# Patient Record
Sex: Female | Born: 1980 | Hispanic: No | Marital: Married | Smoking: Never smoker
Health system: Southern US, Community
[De-identification: ages and names within clinical notes are randomized; demographics above are authoritative.]

## PROBLEM LIST (undated history)

## (undated) DIAGNOSIS — Z3490 Encounter for supervision of normal pregnancy, unspecified, unspecified trimester: Principal | ICD-10-CM

## (undated) DIAGNOSIS — I10 Essential (primary) hypertension: Secondary | ICD-10-CM

## (undated) DIAGNOSIS — Q210 Ventricular septal defect: Secondary | ICD-10-CM

## (undated) DIAGNOSIS — Z9109 Other allergy status, other than to drugs and biological substances: Secondary | ICD-10-CM

## (undated) HISTORY — PX: WISDOM TOOTH EXTRACTION: SHX21

## (undated) HISTORY — DX: Encounter for supervision of normal pregnancy, unspecified, unspecified trimester: Z34.90

## (undated) HISTORY — DX: Essential (primary) hypertension: I10

---

## 2007-08-10 ENCOUNTER — Other Ambulatory Visit: Admission: RE | Admit: 2007-08-10 | Discharge: 2007-08-10 | Payer: Self-pay | Admitting: Obstetrics and Gynecology

## 2009-01-20 ENCOUNTER — Other Ambulatory Visit: Admission: RE | Admit: 2009-01-20 | Discharge: 2009-01-20 | Payer: Self-pay | Admitting: Obstetrics and Gynecology

## 2010-04-23 ENCOUNTER — Emergency Department (HOSPITAL_COMMUNITY): Admission: EM | Admit: 2010-04-23 | Discharge: 2010-04-24 | Payer: Self-pay | Admitting: Emergency Medicine

## 2010-05-18 ENCOUNTER — Other Ambulatory Visit: Admission: RE | Admit: 2010-05-18 | Discharge: 2010-05-18 | Payer: Self-pay | Admitting: Obstetrics & Gynecology

## 2010-12-01 ENCOUNTER — Other Ambulatory Visit: Payer: Self-pay

## 2010-12-01 DIAGNOSIS — Z0489 Encounter for examination and observation for other specified reasons: Secondary | ICD-10-CM

## 2010-12-14 ENCOUNTER — Other Ambulatory Visit (HOSPITAL_COMMUNITY): Payer: Self-pay | Admitting: Maternal and Fetal Medicine

## 2010-12-14 ENCOUNTER — Ambulatory Visit (HOSPITAL_COMMUNITY)
Admission: RE | Admit: 2010-12-14 | Discharge: 2010-12-14 | Disposition: A | Discharge status: Home / Self Care | Payer: No Typology Code available for payment source | Source: Ambulatory Visit | Attending: Obstetrics & Gynecology | Admitting: Obstetrics & Gynecology

## 2010-12-14 DIAGNOSIS — O358XX0 Maternal care for other (suspected) fetal abnormality and damage, not applicable or unspecified: Secondary | ICD-10-CM | POA: Insufficient documentation

## 2010-12-14 DIAGNOSIS — Z1389 Encounter for screening for other disorder: Secondary | ICD-10-CM | POA: Insufficient documentation

## 2010-12-14 DIAGNOSIS — Z0489 Encounter for examination and observation for other specified reasons: Secondary | ICD-10-CM

## 2010-12-14 DIAGNOSIS — Z363 Encounter for antenatal screening for malformations: Secondary | ICD-10-CM | POA: Insufficient documentation

## 2011-01-11 ENCOUNTER — Other Ambulatory Visit: Payer: Self-pay | Admitting: Obstetrics & Gynecology

## 2011-01-11 ENCOUNTER — Ambulatory Visit (HOSPITAL_COMMUNITY)
Admission: RE | Admit: 2011-01-11 | Discharge: 2011-01-11 | Disposition: A | Discharge status: Home / Self Care | Payer: No Typology Code available for payment source | Source: Ambulatory Visit | Attending: Obstetrics & Gynecology | Admitting: Obstetrics & Gynecology

## 2011-01-11 DIAGNOSIS — Z0489 Encounter for examination and observation for other specified reasons: Secondary | ICD-10-CM

## 2011-01-11 DIAGNOSIS — Z3689 Encounter for other specified antenatal screening: Secondary | ICD-10-CM | POA: Insufficient documentation

## 2011-01-25 ENCOUNTER — Ambulatory Visit (HOSPITAL_COMMUNITY): Payer: No Typology Code available for payment source

## 2011-01-28 LAB — DIFFERENTIAL
Basophils Absolute: 0 10*3/uL (ref 0.0–0.1)
Basophils Relative: 0 % (ref 0–1)
Eosinophils Absolute: 0 10*3/uL (ref 0.0–0.7)
Neutro Abs: 15 10*3/uL — ABNORMAL HIGH (ref 1.7–7.7)
Neutrophils Relative %: 95 % — ABNORMAL HIGH (ref 43–77)

## 2011-01-28 LAB — URINE MICROSCOPIC-ADD ON

## 2011-01-28 LAB — CBC
MCHC: 34.7 g/dL (ref 30.0–36.0)
MCV: 85 fL (ref 78.0–100.0)
Platelets: 292 10*3/uL (ref 150–400)
RDW: 12 % (ref 11.5–15.5)

## 2011-01-28 LAB — URINALYSIS, ROUTINE W REFLEX MICROSCOPIC
Bilirubin Urine: NEGATIVE
Glucose, UA: NEGATIVE mg/dL
Ketones, ur: NEGATIVE mg/dL
Leukocytes, UA: NEGATIVE
Nitrite: NEGATIVE
Protein, ur: NEGATIVE mg/dL
Specific Gravity, Urine: 1.025 (ref 1.005–1.030)
Urobilinogen, UA: 0.2 mg/dL (ref 0.0–1.0)
pH: 7.5 (ref 5.0–8.0)

## 2011-01-28 LAB — BASIC METABOLIC PANEL
BUN: 8 mg/dL (ref 6–23)
CO2: 25 mEq/L (ref 19–32)
Calcium: 9.2 mg/dL (ref 8.4–10.5)
Chloride: 103 mEq/L (ref 96–112)
Creatinine, Ser: 0.94 mg/dL (ref 0.4–1.2)
GFR calc Af Amer: >60 mL/min (ref >60)
Glucose, Bld: 85 mg/dL (ref 70–99)

## 2011-01-28 LAB — PREGNANCY, URINE: Preg Test, Ur: NEGATIVE

## 2011-02-08 ENCOUNTER — Other Ambulatory Visit: Payer: Self-pay | Admitting: Obstetrics & Gynecology

## 2011-02-08 ENCOUNTER — Ambulatory Visit (HOSPITAL_COMMUNITY)
Admission: RE | Admit: 2011-02-08 | Discharge: 2011-02-08 | Disposition: A | Discharge status: Home / Self Care | Payer: No Typology Code available for payment source | Source: Ambulatory Visit | Attending: Obstetrics & Gynecology | Admitting: Obstetrics & Gynecology

## 2011-02-08 DIAGNOSIS — IMO0002 Reserved for concepts with insufficient information to code with codable children: Secondary | ICD-10-CM

## 2011-02-08 DIAGNOSIS — Z0489 Encounter for examination and observation for other specified reasons: Secondary | ICD-10-CM

## 2011-02-08 DIAGNOSIS — Z3689 Encounter for other specified antenatal screening: Secondary | ICD-10-CM | POA: Insufficient documentation

## 2011-03-22 ENCOUNTER — Ambulatory Visit (HOSPITAL_COMMUNITY): Payer: No Typology Code available for payment source

## 2011-04-28 ENCOUNTER — Observation Stay (HOSPITAL_COMMUNITY)
Admission: AD | Admit: 2011-04-28 | Discharge: 2011-04-28 | Disposition: A | Discharge status: Home / Self Care | Payer: No Typology Code available for payment source | Source: Ambulatory Visit | Attending: Obstetrics and Gynecology | Admitting: Obstetrics and Gynecology

## 2011-04-28 DIAGNOSIS — O328XX0 Maternal care for other malpresentation of fetus, not applicable or unspecified: Principal | ICD-10-CM | POA: Insufficient documentation

## 2011-04-28 LAB — CBC
Hemoglobin: 13.4 g/dL (ref 12.0–15.0)
MCH: 31.4 pg (ref 26.0–34.0)
Platelets: 260 10*3/uL (ref 150–400)
RBC: 4.27 MIL/uL (ref 3.87–5.11)

## 2011-04-28 LAB — BASIC METABOLIC PANEL
CO2: 18 mEq/L — ABNORMAL LOW (ref 19–32)
Calcium: 9.4 mg/dL (ref 8.4–10.5)
GFR calc non Af Amer: >60 mL/min (ref >60)
Glucose, Bld: 86 mg/dL (ref 70–99)
Potassium: 3.6 mEq/L (ref 3.5–5.1)
Sodium: 130 mEq/L — ABNORMAL LOW (ref 135–145)

## 2011-04-28 LAB — RPR: RPR Ser Ql: NONREACTIVE

## 2011-05-03 ENCOUNTER — Encounter (HOSPITAL_COMMUNITY)
Admission: RE | Admit: 2011-05-03 | Discharge: 2011-05-03 | Payer: No Typology Code available for payment source | Source: Ambulatory Visit | Attending: Obstetrics and Gynecology | Admitting: Obstetrics and Gynecology

## 2011-05-03 ENCOUNTER — Encounter (HOSPITAL_COMMUNITY): Payer: Self-pay

## 2011-05-03 HISTORY — DX: Ventricular septal defect: Q21.0

## 2011-05-03 LAB — COMPREHENSIVE METABOLIC PANEL
ALT: 20 U/L (ref 0–35)
AST: 20 U/L (ref 0–37)
Alkaline Phosphatase: 140 U/L — ABNORMAL HIGH (ref 39–117)
CO2: 21 mEq/L (ref 19–32)
Calcium: 9.9 mg/dL (ref 8.4–10.5)
Glucose, Bld: 78 mg/dL (ref 70–99)
Potassium: 3.4 mEq/L — ABNORMAL LOW (ref 3.5–5.1)
Sodium: 130 mEq/L — ABNORMAL LOW (ref 135–145)
Total Protein: 7 g/dL (ref 6.0–8.3)

## 2011-05-03 LAB — RPR: RPR Ser Ql: NONREACTIVE

## 2011-05-04 ENCOUNTER — Other Ambulatory Visit: Payer: Self-pay | Admitting: Obstetrics & Gynecology

## 2011-05-04 ENCOUNTER — Inpatient Hospital Stay (HOSPITAL_COMMUNITY)
Admission: RE | Admit: 2011-05-04 | Discharge: 2011-05-06 | DRG: 766 | Disposition: A | Discharge status: Home / Self Care | Payer: No Typology Code available for payment source | Source: Ambulatory Visit | Attending: Obstetrics & Gynecology | Admitting: Obstetrics & Gynecology

## 2011-05-04 DIAGNOSIS — Z01812 Encounter for preprocedural laboratory examination: Secondary | ICD-10-CM

## 2011-05-04 DIAGNOSIS — Z01818 Encounter for other preprocedural examination: Secondary | ICD-10-CM

## 2011-05-04 DIAGNOSIS — O321XX0 Maternal care for breech presentation, not applicable or unspecified: Principal | ICD-10-CM | POA: Diagnosis present

## 2011-05-04 LAB — CBC
HCT: 37.7 % (ref 36.0–46.0)
Hemoglobin: 13.4 g/dL (ref 12.0–15.0)
MCV: 87.7 fL (ref 78.0–100.0)
RBC: 4.3 MIL/uL (ref 3.87–5.11)
WBC: 13.7 10*3/uL — ABNORMAL HIGH (ref 4.0–10.5)

## 2011-05-05 LAB — CBC
HCT: 29.9 % — ABNORMAL LOW (ref 36.0–46.0)
Hemoglobin: 10.1 g/dL — ABNORMAL LOW (ref 12.0–15.0)
MCH: 30.4 pg (ref 26.0–34.0)
MCV: 90.1 fL (ref 78.0–100.0)
Platelets: 198 10*3/uL (ref 150–400)
RBC: 3.32 MIL/uL — ABNORMAL LOW (ref 3.87–5.11)
WBC: 13.2 10*3/uL — ABNORMAL HIGH (ref 4.0–10.5)

## 2011-05-05 NOTE — Op Note (Signed)
[name], [name] NO.:  [id number]  MEDICAL RECORD NO.:  [id number]  LOCATION:  9126                          FACILITY:  [facility]  PHYSICIAN:  Lazaro Arms, M.D.   DATE OF BIRTH:  05-15-1981  DATE OF PROCEDURE:  05/04/2011 DATE OF DISCHARGE:                              OPERATIVE REPORT   PREOPERATIVE DIAGNOSES: 1. Intrauterine pregnancy at 40 weeks' gestation. 2. [name] breech presentation, status post failed external cephalic     version. 3. Spontaneous rupture of membranes with particulate meconium.  POST DIAGNOSES: 1. Intrauterine pregnancy at 26 weeks' gestation. 2. [name] breech presentation, status post failed external cephalic     version. 3. Spontaneous rupture of membranes with particulate meconium.  PROCEDURE:  Primary low-transverse cesarean resection.  SURGEON:  Lazaro Arms, M.D.  ANESTHESIA:  Spinal.  FINDINGS:  The patient was known to have a baby in the breech presentation.  She came in last week for external cephalic version, which was failed.  She was scheduled for C-section, Monday, 25th; however, she came in with rupture of membranes this morning.  At time of surgery, she was again confirmed to be in a frank breech presentation and undeveloped lower uterine segment.  Uterus, tubes, and ovaries normal.  We delivered a viable female at 7:14 with Apgars of 8 and 9, weighing 5 pounds and 12 ounces.  There was three-vessel cord.  Cord blood and cord gas were sent.  Cord pH was 7.30.  DESCRIPTION OF OPERATION:  The patient was taken to the operating room, placed in sitting position where she underwent spinal anesthetic.  She was then placed in supine position, tilted on the left side.  She was prepped and draped in usual sterile fashion.  A Foley catheter was placed.  Pfannenstiel's skin incision was made, carried down sharply through rectus fascia, scored in midline, extended laterally.  Fascia taken off the muscle superiorly and  inferiorly without difficulty. Muscles were divided.  The peritoneal cavity was entered.  The bladder blade was placed.  A low-transverse hysterotomy incision was made over this incision, delivered a viable female infant at 07:14 with Apgars of 8 and 9, weighing 5 pounds 12 ounces, three-vessel cord.  Cord blood was sent.  Cord pH 7.30.  The infant was in a frank breech presentation, assisted breech extraction was performed.  The infant underwent routine neonatal resuscitation.  I did do a DeLee in the abdomen and I got clear fluid, so obviously the particular meconium did not mix with the regular amniotic fluid.  She experienced rupture of membranes and since it was breech, she did not make out there the fluid was clear out of the nasogastric suction.  The placenta was delivered spontaneously.  The uterus was then exteriorized and closed in two layers, first being running interlocking layer, the second being imbricating layer, and several interrupted figure-of-eight sutures were placed for additional hemostasis.  The uterus was replaced into the peritoneal cavity.  The muscles and peritoneum reapproximated loosely.  The fascia closed using 0 Vicryl running.  The subcutaneous tissue was made hemostatic, irrigated, and reapproximated loosely.  The skin was closed using 4-0 Vicryl in a subcuticular  fashion with a Mellody Dance needle of 4-0 Vicryl and Dermabond. The patient tolerated procedure well.  She experienced 750 mL of blood loss and was taken to the recovery room in good and stable condition. All counts correct x3.  She received Ancef prophylactically.     Lazaro Arms, M.D.     Loraine Maple  D:  05/04/2011  T:  05/04/2011  Job:  [id number]  Electronically Signed by Duane Lope M.D. on 05/05/2011 10:05:57 AM

## 2011-05-08 NOTE — Discharge Summary (Signed)
  NAME:  [name], [name] NO.:  [id number]  MEDICAL RECORD NO.:  [id number]  LOCATION:  9126                          [facility]ILITY:  [facility]  PHYSICIAN:  Lazaro Arms, M.D.   DATE OF BIRTH:  23-Oct-1981  DATE OF ADMISSION:  05/04/2011 DATE OF DISCHARGE:  05/06/2011                              DISCHARGE SUMMARY   DISCHARGE DIAGNOSES: 1. Status post primary cesarean section for breech presentation,     failed version, and spontaneous rupture of membranes. 2. Unremarkable postoperative course.  PROCEDURE:  Primary low transverse resection.  SURGEON:  Lazaro Arms, MD  Please refer to the antepartum chart and the handwritten history and physical details for admission in the hospital.  HOSPITAL COURSE:  The patient was admitted for a C-section.  She had had a failed version attempt earlier in the week and was then scheduled to have a C-section on May 07, 2011, however, she presented prior to that with rupture of membranes.  Please refer to the operative note for details.  Postoperatively, she did well.  She tolerated clear liquids and regular diet and was without symptoms, was essentially ambulatory. She was afebrile.  Her abdomen was benign.  Her incision was clean, dry, and intact.  She was discharged home on the morning of postop day number technically 2 in good stable condition.  She was given Percocet and Motrin for pain and follow up in the office next week.  She already has an appointment in [facility].     Lazaro Arms, M.D.     Loraine Maple  D:  05/06/2011  T:  05/06/2011  Job:  [id number]  Electronically Signed by Duane Lope M.D. on 05/08/2011 02:33:50 PM

## 2011-05-12 NOTE — H&P (Signed)
NAME:  [name], [name] NO.:  [id number]  MEDICAL RECORD NO.:  [id number]  LOCATION:                                 FACILITY:  PHYSICIAN:  Tilda Burrow, M.D. DATE OF BIRTH:  November 20, 1980  DATE OF ADMISSION: DATE OF DISCHARGE:                             HISTORY & PHYSICAL   ADMITTING DIAGNOSES: 1. Pregnancy 38 weeks' gestation. 2. Persistent breech presentation. 3. History of maternal VSD (ventricular septal defect) with small left-     to-right shunt.  HISTORY OF PRESENT ILLNESS:  This 30 year old female gravida 1, para 0-0- 0, LMP unknown with ultrasound assigned EDC of May 11, 2011, based on 9- week 6-day ultrasound, followed through [facility] through the pregnancy with confirmatory ultrasound at 18 weeks and 6 days at [facility], is admitted at this time for external cephalic version.  She has had been found to be in a breech presentation with ultrasound on April 26, 2011, showing adequate amniotic fluid with fetal vertex and a breech presentation with the fetal back crossing the maternal spine with the head slightly to the left of the midline and buttocks anterior and to the right side.  Placenta is posteriorly located.  The patient has been counseled over external version with technical aspects of the procedure reviewed including risk of fetal distress associated with the procedure, need for fetal monitoring prior to and after procedure reviewed with the patient, the alternative of proceeding directly to schedule cesarean section at 39 weeks has been discussed. The patient requests proceeding with attempted external version which is scheduled for April 28, 2011.  MEDICAL HISTORY:  Significant for the patient has unrepaired VSD.  The patient has described her functioning baseline activity is equal to the baseline.  She had an maternal echo in Cardiology consult performed at [facility], January 30, 2011.  The  interpretation of the ultrasound showed a small perimembranous ventricular septal defect with peak gradient across the VSD 113 mmHg with "normal biventricular size and function."  Additionally, a fetal cardiac echo was performed at that date and showed a normal-appearing ventricular septum in the infant.  This patient's VSD has been discussed with Anesthesia and they will be asked to see the patient while she is in the hospital for her external version.  PAST SURGICAL HISTORY:  Oral only.  ALLERGIES:  None.  SOCIAL HISTORY:  Married, Armed forces operational officer.  PRENATAL LABS:  Blood type O positive, rubella immune present, hemoglobin 13, hematocrit 39, platelets 313,000.  Hepatitis, HIV, RPR, GC and Chlamydia all negative.  Pap smear normal.  MSAFP normal.  Group B strep negative.  Glucose tolerance test normal at 73, 114, 107.  A 28- week hematocrit 37.6 and platelets 270,000.  She attended childbirth classes.  Her partner, Ashari Llewellyn, is supportive.  She plans to take the baby to Dr. Laural Benes of [facility].  PHYSICAL EXAMINATION:  VITAL SIGNS:  Weight 216.4, blood pressure 120/80. CARDIAC:  Shows a large holosystolic murmur without appreciable S4 that is noticeable over the anterior precordium. ABDOMEN:  Gravid uterus, 37-cm fundal height with fetus considered to be growing within normal limits for this pregnancy, was at  the 40th percentile early in the pregnancy at 23 weeks.  PLAN:  External cephalic version on April 28, 2011, and anesthesia consult April 28, 2011.     Tilda Burrow, M.D.     JVF/MEDQ  D:  04/26/2011  T:  04/27/2011  Job:  [id number]  cc:   [facility]  Electronically Signed by Christin Bach M.D. on 05/12/2011 10:24:15 PM

## 2011-05-12 NOTE — Op Note (Signed)
  [name], EASTLAND NO.:  [id number]  MEDICAL RECORD NO.:  [id number]  LOCATION:  9172                          FACILITY:  [facility]  PHYSICIAN:  Tilda Burrow, M.D. DATE OF BIRTH:  1981-03-07  DATE OF PROCEDURE:  04/28/2011 DATE OF DISCHARGE:  04/28/2011                              OPERATIVE REPORT   PREOPERATIVE DIAGNOSIS:  Pregnancy 38 weeks' gestation, but persistent footling and breech presentation.  POSTOPERATIVE DIAGNOSIS:  Pregnancy 38 weeks' gestation, but persistent footling and breech presentation.  PROCEDURE:  Unsuccessful attempted external cephalic version.  SURGEON:  Tilda Burrow, MD  INDICATIONS:  Persistent breech presentation at 88 weeks' gestation, counseled and consented for external cephalic version to attempt to avoid need for cesarean section.  Blood type is confirmed as Rh positive.  DETAILS OF PROCEDURE:  After a lengthy delay due to OR obligations, terbutaline was administered after ultrasound confirmed breech presentation with vertex in the left upper quadrant and adequate fluid noted on ultrasound and the placenta noted to be posterior with forward roll technique used on several attempts.  The vertex could be brought down to approximately 3 o'clock on the abdomen, but could not be made to flip further.  The feet and the buttocks remained firmly settled into the lower uterine segment.  Maternal repositioning did not result in different outcome.  Intermittent fetal monitoring by ultrasound and external Doppler confirmed that heart rate remained normal range.  The baby will be monitored to meet NST reactive criteria prior to discharge and surgery scheduled for 1 week at 39 weeks' gestation.     Tilda Burrow, M.D.     JVF/MEDQ  D:  04/28/2011  T:  04/28/2011  Job:  [id number]  cc:   [facility]  Electronically Signed by Christin Bach M.D. on 05/12/2011 10:24:05 PM

## 2011-09-13 ENCOUNTER — Other Ambulatory Visit: Payer: Self-pay | Admitting: Obstetrics & Gynecology

## 2011-09-13 ENCOUNTER — Other Ambulatory Visit (HOSPITAL_COMMUNITY)
Admission: RE | Admit: 2011-09-13 | Discharge: 2011-09-13 | Disposition: A | Discharge status: Home / Self Care | Payer: No Typology Code available for payment source | Source: Ambulatory Visit | Attending: Obstetrics & Gynecology | Admitting: Obstetrics & Gynecology

## 2011-09-13 DIAGNOSIS — Z01419 Encounter for gynecological examination (general) (routine) without abnormal findings: Secondary | ICD-10-CM | POA: Insufficient documentation

## 2012-09-25 ENCOUNTER — Other Ambulatory Visit: Payer: Self-pay | Admitting: Obstetrics & Gynecology

## 2012-09-25 ENCOUNTER — Other Ambulatory Visit (HOSPITAL_COMMUNITY)
Admission: RE | Admit: 2012-09-25 | Discharge: 2012-09-25 | Disposition: A | Discharge status: Home / Self Care | Payer: No Typology Code available for payment source | Source: Ambulatory Visit | Attending: Obstetrics & Gynecology | Admitting: Obstetrics & Gynecology

## 2012-09-25 DIAGNOSIS — Z1151 Encounter for screening for human papillomavirus (HPV): Secondary | ICD-10-CM | POA: Insufficient documentation

## 2012-09-25 DIAGNOSIS — Z01419 Encounter for gynecological examination (general) (routine) without abnormal findings: Secondary | ICD-10-CM | POA: Insufficient documentation

## 2013-07-07 ENCOUNTER — Other Ambulatory Visit: Payer: Self-pay | Admitting: *Deleted

## 2013-07-07 MED ORDER — DESOGESTREL-ETHINYL ESTRADIOL 0.15-0.02/0.01 MG (21/5) PO TABS: 1.0000 | ORAL_TABLET | Freq: Every day | ORAL | Status: DC

## 2013-10-01 ENCOUNTER — Encounter: Payer: Self-pay | Admitting: Obstetrics & Gynecology

## 2013-10-01 ENCOUNTER — Ambulatory Visit (INDEPENDENT_AMBULATORY_CARE_PROVIDER_SITE_OTHER): Payer: PRIVATE HEALTH INSURANCE | Admitting: Obstetrics & Gynecology

## 2013-10-01 ENCOUNTER — Other Ambulatory Visit (HOSPITAL_COMMUNITY)
Admission: RE | Admit: 2013-10-01 | Discharge: 2013-10-01 | Disposition: A | Discharge status: Home / Self Care | Payer: No Typology Code available for payment source | Source: Ambulatory Visit | Attending: Obstetrics & Gynecology | Admitting: Obstetrics & Gynecology

## 2013-10-01 ENCOUNTER — Other Ambulatory Visit: Payer: Self-pay | Admitting: Obstetrics & Gynecology

## 2013-10-01 VITALS — BP 120/80 | Ht 65.0 in | Wt 197.0 lb

## 2013-10-01 DIAGNOSIS — Z01419 Encounter for gynecological examination (general) (routine) without abnormal findings: Secondary | ICD-10-CM | POA: Insufficient documentation

## 2013-10-01 MED ORDER — FLUCONAZOLE 150 MG PO TABS: ORAL_TABLET | ORAL | Status: DC

## 2013-10-01 NOTE — Addendum Note (Signed)
Addended by: Richardson Chiquito on: 10/01/2013 01:39 PM   Modules accepted: Orders

## 2013-10-01 NOTE — Progress Notes (Signed)
Patient ID: [name], female   DOB: Jan 23, 1981, 32 y.o.   MRN: [id number] Subjective:     [name] is a 32 y.o. female here for a routine exam.  Patient's last menstrual period was 09/28/2013. G1P1 Current complaints: post menses yeast infection.  Personal health questionnaire reviewed: .   Gynecologic History Patient's last menstrual period was 09/28/2013. Contraception: OCP (estrogen/progesterone) Last Pap: 2013. Results were: normal Last mammogram: na. Results were: na  Past Medical History  Diagnosis Date  . VSD (ventricular septal defect)     history of maternal septal defect with small left to right shunt-no prob    Past Surgical History  Procedure Laterality Date  . Wisdom tooth extraction      2005    OB History   Grav Para Term Preterm Abortions TAB SAB Ect Mult Living   1               History   Social History  . Marital Status: Married    Spouse Name: N/A    Number of Children: N/A  . Years of Education: N/A   Social History Main Topics  . Smoking status: Never Smoker   . Smokeless tobacco: None  . Alcohol Use: No  . Drug Use: No  . Sexual Activity: No   Other Topics Concern  . None   Social History Narrative  . None    Family History  Problem Relation Age of Onset  . Hypertension Mother   . Hypertension Father   . Stroke Paternal Grandmother   . Lung cancer Paternal Grandfather      Review of Systems  Review of Systems  Constitutional: Negative for fever, chills, weight loss, malaise/fatigue and diaphoresis.  HENT: Negative for hearing loss, ear pain, nosebleeds, congestion, sore throat, neck pain, tinnitus and ear discharge.   Eyes: Negative for blurred vision, double vision, photophobia, pain, discharge and redness.  Respiratory: Negative for cough, hemoptysis, sputum production, shortness of breath, wheezing and stridor.   Cardiovascular: Negative for chest pain, palpitations, orthopnea, claudication, leg swelling and PND.   Gastrointestinal: negative for abdominal pain. Negative for heartburn, nausea, vomiting, diarrhea, constipation, blood in stool and melena.  Genitourinary: Negative for dysuria, urgency, frequency, hematuria and flank pain.  Musculoskeletal: Negative for myalgias, back pain, joint pain and falls.  Skin: Negative for itching and rash.  Neurological: Negative for dizziness, tingling, tremors, sensory change, speech change, focal weakness, seizures, loss of consciousness, weakness and headaches.  Endo/Heme/Allergies: Negative for environmental allergies and polydipsia. Does not bruise/bleed easily.  Psychiatric/Behavioral: Negative for depression, suicidal ideas, hallucinations, memory loss and substance abuse. The patient is not nervous/anxious and does not have insomnia.        Objective:    Physical Exam  Vitals reviewed. Constitutional: She is oriented to person, place, and time. She appears well-developed and well-nourished.  HENT:  Head: Normocephalic and atraumatic.        Right Ear: External ear normal.  Left Ear: External ear normal.  Nose: Nose normal.  Mouth/Throat: Oropharynx is clear and moist.  Eyes: Conjunctivae and EOM are normal. Pupils are equal, round, and reactive to light. Right eye exhibits no discharge. Left eye exhibits no discharge. No scleral icterus.  Neck: Normal range of motion. Neck supple. No tracheal deviation present. No thyromegaly present.  Cardiovascular: Normal rate, regular rhythm, normal heart sounds and intact distal pulses. Grade III/VI systolic flow murmur(Pt has a congenital VSD, unrepaired)  Respiratory: Effort normal and breath sounds normal.  No respiratory distress. She has no wheezes. She has no rales. She exhibits no tenderness.  GI: Soft. Bowel sounds are normal. She exhibits no distension and no mass. There is no tenderness. There is no rebound and no guarding.  Genitourinary:  Breasts no masses skin changes or nipple changes bilaterally       Vulva is normal without lesions Vagina is pink moist without discharge Cervix normal in appearance and pap is done Uterus is normal size shape and contour Adnexa is negative with normal sized ovaries    Musculoskeletal: Normal range of motion. She exhibits no edema and no tenderness.  Neurological: She is alert and oriented to person, place, and time. She has normal reflexes. She displays normal reflexes. No cranial nerve deficit. She exhibits normal muscle tone. Coordination normal.  Skin: Skin is warm and dry. No rash noted. No erythema. No pallor.  Psychiatric: She has a normal mood and affect. Her behavior is normal. Judgment and thought content normal.       Assessment:    Healthy female exam.    Plan:    Follow up in: 1 year.

## 2013-11-26 ENCOUNTER — Telehealth: Payer: Self-pay | Admitting: Obstetrics and Gynecology

## 2013-11-26 NOTE — Telephone Encounter (Signed)
Pt states that she is currently on her period and last night she had a gush of blood with clots. Pt states that she has never had that happen before. Pt is concerned. Pt stated that she didn't have any cramping. Pt stated that this happened on the second day of her cycle. I spoke with JAG and she advised that sometimes a blood clot can get stuck which can cause a gush of fluid. Pt also advised that if things got worse or happen again to call the office. Pt was offered an appointment for early next week and she declined.

## 2014-03-14 ENCOUNTER — Other Ambulatory Visit: Payer: Self-pay | Admitting: *Deleted

## 2014-03-15 MED ORDER — FLUCONAZOLE 150 MG PO TABS: ORAL_TABLET | ORAL | Status: DC

## 2014-07-06 ENCOUNTER — Other Ambulatory Visit: Payer: Self-pay | Admitting: *Deleted

## 2014-07-06 MED ORDER — DESOGESTREL-ETHINYL ESTRADIOL 0.15-0.02/0.01 MG (21/5) PO TABS: 1.0000 | ORAL_TABLET | Freq: Every day | ORAL | Status: DC

## 2014-09-12 ENCOUNTER — Encounter: Payer: Self-pay | Admitting: Obstetrics & Gynecology

## 2014-10-14 ENCOUNTER — Other Ambulatory Visit: Payer: PRIVATE HEALTH INSURANCE | Admitting: Obstetrics & Gynecology

## 2014-10-14 ENCOUNTER — Other Ambulatory Visit (HOSPITAL_COMMUNITY)
Admission: RE | Admit: 2014-10-14 | Discharge: 2014-10-14 | Disposition: A | Discharge status: Home / Self Care | Payer: No Typology Code available for payment source | Source: Ambulatory Visit | Attending: Obstetrics & Gynecology | Admitting: Obstetrics & Gynecology

## 2014-10-14 ENCOUNTER — Ambulatory Visit (INDEPENDENT_AMBULATORY_CARE_PROVIDER_SITE_OTHER): Payer: PRIVATE HEALTH INSURANCE | Admitting: Obstetrics & Gynecology

## 2014-10-14 ENCOUNTER — Encounter: Payer: Self-pay | Admitting: Obstetrics & Gynecology

## 2014-10-14 VITALS — BP 130/80 | Ht 65.5 in | Wt 203.0 lb

## 2014-10-14 DIAGNOSIS — Z01419 Encounter for gynecological examination (general) (routine) without abnormal findings: Secondary | ICD-10-CM

## 2014-10-14 NOTE — Progress Notes (Signed)
Patient ID: [name], female   DOB: December 11, 1980, 33 y.o.   MRN: [id number] Subjective:     [name] is a 33 y.o. female here for a routine exam.  Patient's last menstrual period was 09/25/2014. G1P0 Birth Control Method:  OCP now stopped Menstrual Calendar(currently): regular  Current complaints: none.   Current acute medical issues:  none   Recent Gynecologic History Patient's last menstrual period was 09/25/2014. Last Pap: 2014,  normal Last mammogram: na,    Past Medical History  Diagnosis Date  . VSD (ventricular septal defect)     history of maternal septal defect with small left to right shunt-no prob    Past Surgical History  Procedure Laterality Date  . Wisdom tooth extraction      2005    OB History    Gravida Para Term Preterm AB TAB SAB Ectopic Multiple Living   1               History   Social History  . Marital Status: Married    Spouse Name: N/A    Number of Children: N/A  . Years of Education: N/A   Social History Main Topics  . Smoking status: Never Smoker   . Smokeless tobacco: Never Used  . Alcohol Use: No  . Drug Use: No  . Sexual Activity: Yes   Other Topics Concern  . None   Social History Narrative    Family History  Problem Relation Age of Onset  . Hypertension Mother   . Hypertension Father   . Stroke Paternal Grandmother   . Stroke Maternal Grandmother   . Cancer Maternal Grandfather     lung    Current outpatient prescriptions: fluconazole (DIFLUCAN) 150 MG tablet, Take 1 tablet daily, Disp: 7 tablet, Rfl: 1;  loratadine (CLARITIN) 10 MG tablet, Take 10 mg by mouth daily., Disp: , Rfl: ;  desogestrel-ethinyl estradiol (AZURETTE) 0.15-0.02/0.01 MG (21/5) tablet, Take 1 tablet by mouth daily. (Patient not taking: Reported on 10/14/2014), Disp: 1 Package, Rfl: 11 fluconazole (DIFLUCAN) 150 MG tablet, Take 1 tablet by mouth once daily for 7 days (Patient not taking: Reported on 10/14/2014), Disp: 7 tablet, Rfl: 0  Review  of Systems  Review of Systems  Constitutional: Negative for fever, chills, weight loss, malaise/fatigue and diaphoresis.  HENT: Negative for hearing loss, ear pain, nosebleeds, congestion, sore throat, neck pain, tinnitus and ear discharge.   Eyes: Negative for blurred vision, double vision, photophobia, pain, discharge and redness.  Respiratory: Negative for cough, hemoptysis, sputum production, shortness of breath, wheezing and stridor.   Cardiovascular: Negative for chest pain, palpitations, orthopnea, claudication, leg swelling and PND.  Gastrointestinal: negative for abdominal pain. Negative for heartburn, nausea, vomiting, diarrhea, constipation, blood in stool and melena.  Genitourinary: Negative for dysuria, urgency, frequency, hematuria and flank pain.  Musculoskeletal: Negative for myalgias, back pain, joint pain and falls.  Skin: Negative for itching and rash.  Neurological: Negative for dizziness, tingling, tremors, sensory change, speech change, focal weakness, seizures, loss of consciousness, weakness and headaches.  Endo/Heme/Allergies: Negative for environmental allergies and polydipsia. Does not bruise/bleed easily.  Psychiatric/Behavioral: Negative for depression, suicidal ideas, hallucinations, memory loss and substance abuse. The patient is not nervous/anxious and does not have insomnia.        Objective:  Blood pressure 130/80, height 5' 5.5" (1.664 m), weight 203 lb (92.08 kg), last menstrual period 09/25/2014, unknown if currently breastfeeding.   Physical Exam  Vitals reviewed. Constitutional: She is oriented to  person, place, and time. She appears well-developed and well-nourished.  HENT:  Head: Normocephalic and atraumatic.        Right Ear: External ear normal.  Left Ear: External ear normal.  Nose: Nose normal.  Mouth/Throat: Oropharynx is clear and moist.  Eyes: Conjunctivae and EOM are normal. Pupils are equal, round, and reactive to light. Right eye  exhibits no discharge. Left eye exhibits no discharge. No scleral icterus.  Neck: Normal range of motion. Neck supple. No tracheal deviation present. No thyromegaly present.  Cardiovascular: Normal rate, regular rhythm, normal heart sounds and intact distal pulses.  Exam reveals no gallop and no friction rub.   No murmur heard. Respiratory: Effort normal and breath sounds normal. No respiratory distress. She has no wheezes. She has no rales. She exhibits no tenderness.  GI: Soft. Bowel sounds are normal. She exhibits no distension and no mass. There is no tenderness. There is no rebound and no guarding.  Genitourinary:  Breasts no masses skin changes or nipple changes bilaterally      Vulva is normal without lesions Vagina is pink moist without discharge Cervix normal in appearance and pap is done Uterus is normal size shape and contour Adnexa is negative with normal sized ovaries   Musculoskeletal: Normal range of motion. She exhibits no edema and no tenderness.  Neurological: She is alert and oriented to person, place, and time. She has normal reflexes. She displays normal reflexes. No cranial nerve deficit. She exhibits normal muscle tone. Coordination normal.  Skin: Skin is warm and dry. No rash noted. No erythema. No pallor.  Psychiatric: She has a normal mood and affect. Her behavior is normal. Judgment and thought content normal.       Assessment:    Healthy female exam.    Plan:    wanting to get pregnant, will follow up then

## 2014-10-17 LAB — CYTOLOGY - PAP

## 2015-02-24 ENCOUNTER — Other Ambulatory Visit: Payer: Self-pay | Admitting: Obstetrics & Gynecology

## 2015-02-28 ENCOUNTER — Other Ambulatory Visit: Payer: Self-pay | Admitting: *Deleted

## 2015-02-28 MED ORDER — FLUCONAZOLE 150 MG PO TABS: ORAL_TABLET | ORAL | Status: DC

## 2015-07-10 ENCOUNTER — Ambulatory Visit: Payer: PRIVATE HEALTH INSURANCE | Admitting: Adult Health

## 2015-07-11 ENCOUNTER — Ambulatory Visit (INDEPENDENT_AMBULATORY_CARE_PROVIDER_SITE_OTHER): Payer: PRIVATE HEALTH INSURANCE | Admitting: Adult Health

## 2015-07-11 ENCOUNTER — Encounter: Payer: Self-pay | Admitting: Adult Health

## 2015-07-11 VITALS — BP 120/72 | HR 104 | Ht 67.0 in | Wt 212.0 lb

## 2015-07-11 DIAGNOSIS — N925 Other specified irregular menstruation: Secondary | ICD-10-CM | POA: Diagnosis not present

## 2015-07-11 DIAGNOSIS — Z3490 Encounter for supervision of normal pregnancy, unspecified, unspecified trimester: Secondary | ICD-10-CM

## 2015-07-11 DIAGNOSIS — Z3201 Encounter for pregnancy test, result positive: Secondary | ICD-10-CM | POA: Diagnosis not present

## 2015-07-11 DIAGNOSIS — O3680X0 Pregnancy with inconclusive fetal viability, not applicable or unspecified: Secondary | ICD-10-CM

## 2015-07-11 HISTORY — DX: Encounter for supervision of normal pregnancy, unspecified, unspecified trimester: Z34.90

## 2015-07-11 LAB — POCT URINE PREGNANCY: Preg Test, Ur: POSITIVE — AB

## 2015-07-11 MED ORDER — PRENATAL PLUS 27-1 MG PO TABS: 1.0000 | ORAL_TABLET | Freq: Every day | ORAL | Status: DC

## 2015-07-11 NOTE — Progress Notes (Signed)
Subjective:     Patient ID: [name], female   DOB: 1981/03/26, 34 y.o.   MRN: [id number]  HPI [name] is a 34 year old white female, married in for UPT, was not really trying get yet, but is happy, she has a headache and dizziness at times, she had with daughter who is now 11.She works at [employer] development [employer] in [place].She said she has taken diflucan recently for yeast, told to not take anymore.  Review of Systems Patient denies any hearing loss, fatigue, blurred vision, shortness of breath, chest pain, abdominal pain, problems with bowel movements, urination, or intercourse. No joint pain or mood swings.See HPI for positives.  Reviewed past medical,surgical, social and family history. Reviewed medications and allergies.     Objective:   Physical Exam BP 120/72 mmHg  Pulse 104  Ht  (1.702 m)  Wt 212 lb (96.163 kg)  BMI 33.20 kg/m2  LMP 05/22/2015 (Exact Date)  Breastfeeding? No UPT +, Skin warm and dry. Neck: mid line trachea, normal thyroid, good ROM, no lymphadenopathy noted. Lungs: clear to ausculation bilaterally. Cardiovascular: regular rate and rhythm, has murmur from SVD, abdomen soft, non tender, US shows IUP with ?YS no fetal pole seen yet, will get [place] 9/9, should be 7+1 week by LMP, with EDD 02/26/16, but not that far by [place].    Assessment:     Pregnant     Plan:     Return 9/9 for dating [place] Rx prenatal plus #30 take 1 daily with 11 refills  Review handout on first trimester Ok to take tylenol, can take dramamine if needed, declines diclegis, do not take ibuprofen

## 2015-07-11 NOTE — Patient Instructions (Signed)
First Trimester of Pregnancy The first trimester of pregnancy is from week 1 until the end of week 12 (months 1 through 3). A week after a sperm fertilizes an egg, the egg will implant on the wall of the uterus. This embryo will begin to develop into a baby. Genes from you and your partner are forming the baby. The female genes determine whether the baby is a boy or a girl. At 6-8 weeks, the eyes and face are formed, and the heartbeat can be seen on ultrasound. At the end of 12 weeks, all the baby's organs are formed.  Now that you are pregnant, you will want to do everything you can to have a healthy baby. Two of the most important things are to get good prenatal care and to follow your health care provider's instructions. Prenatal care is all the medical care you receive before the baby's birth. This care will help prevent, find, and treat any problems during the pregnancy and childbirth. BODY CHANGES Your body goes through many changes during pregnancy. The changes vary from woman to woman.   You may gain or lose a couple of pounds at first.  You may feel sick to your stomach (nauseous) and throw up (vomit). If the vomiting is uncontrollable, call your health care provider.  You may tire easily.  You may develop headaches that can be relieved by medicines approved by your health care provider.  You may urinate more often. Painful urination may mean you have a bladder infection.  You may develop heartburn as a result of your pregnancy.  You may develop constipation because certain hormones are causing the muscles that push waste through your intestines to slow down.  You may develop hemorrhoids or swollen, bulging veins (varicose veins).  Your breasts may begin to grow larger and become tender. Your nipples may stick out more, and the tissue that surrounds them (areola) may become darker.  Your gums may bleed and may be sensitive to brushing and flossing.  Dark spots or blotches (chloasma,  mask of pregnancy) may develop on your face. This will likely fade after the baby is born.  Your menstrual periods will stop.  You may have a loss of appetite.  You may develop cravings for certain kinds of food.  You may have changes in your emotions from day to day, such as being excited to be pregnant or being concerned that something may go wrong with the pregnancy and baby.  You may have more vivid and strange dreams.  You may have changes in your hair. These can include thickening of your hair, rapid growth, and changes in texture. Some women also have hair loss during or after pregnancy, or hair that feels dry or thin. Your hair will most likely return to normal after your baby is born. WHAT TO EXPECT AT YOUR PRENATAL VISITS During a routine prenatal visit:  You will be weighed to make sure you and the baby are growing normally.  Your blood pressure will be taken.  Your abdomen will be measured to track your baby's growth.  The fetal heartbeat will be listened to starting around week 10 or 12 of your pregnancy.  Test results from any previous visits will be discussed. Your health care provider may ask you:  How you are feeling.  If you are feeling the baby move.  If you have had any abnormal symptoms, such as leaking fluid, bleeding, severe headaches, or abdominal cramping.  If you have any questions. Other tests   that may be performed during your first trimester include:  Blood tests to find your blood type and to check for the presence of any previous infections. They will also be used to check for low iron levels (anemia) and Rh antibodies. Later in the pregnancy, blood tests for diabetes will be done along with other tests if problems develop.  Urine tests to check for infections, diabetes, or protein in the urine.  An ultrasound to confirm the proper growth and development of the baby.  An amniocentesis to check for possible genetic problems.  Fetal screens for  spina bifida and Down syndrome.  You may need other tests to make sure you and the baby are doing well. HOME CARE INSTRUCTIONS  Medicines  Follow your health care provider's instructions regarding medicine use. Specific medicines may be either safe or unsafe to take during pregnancy.  Take your prenatal vitamins as directed.  If you develop constipation, try taking a stool softener if your health care provider approves. Diet  Eat regular, well-balanced meals. Choose a variety of foods, such as meat or vegetable-based protein, fish, milk and low-fat dairy products, vegetables, fruits, and whole grain breads and cereals. Your health care provider will help you determine the amount of weight gain that is right for you.  Avoid raw meat and uncooked cheese. These carry germs that can cause birth defects in the baby.  Eating four or five small meals rather than three large meals a day may help relieve nausea and vomiting. If you start to feel nauseous, eating a few soda crackers can be helpful. Drinking liquids between meals instead of during meals also seems to help nausea and vomiting.  If you develop constipation, eat more high-fiber foods, such as fresh vegetables or fruit and whole grains. Drink enough fluids to keep your urine clear or pale yellow. Activity and Exercise  Exercise only as directed by your health care provider. Exercising will help you:  Control your weight.  Stay in shape.  Be prepared for labor and delivery.  Experiencing pain or cramping in the lower abdomen or low back is a good sign that you should stop exercising. Check with your health care provider before continuing normal exercises.  Try to avoid standing for long periods of time. Move your legs often if you must stand in one place for a long time.  Avoid heavy lifting.  Wear low-heeled shoes, and practice good posture.  You may continue to have sex unless your health care provider directs you  otherwise. Relief of Pain or Discomfort  Wear a good support bra for breast tenderness.   Take warm sitz baths to soothe any pain or discomfort caused by hemorrhoids. Use hemorrhoid cream if your health care provider approves.   Rest with your legs elevated if you have leg cramps or low back pain.  If you develop varicose veins in your legs, wear support hose. Elevate your feet for 15 minutes, 3-4 times a day. Limit salt in your diet. Prenatal Care  Schedule your prenatal visits by the twelfth week of pregnancy. They are usually scheduled monthly at first, then more often in the last 2 months before delivery.  Write down your questions. Take them to your prenatal visits.  Keep all your prenatal visits as directed by your health care provider. Safety  Wear your seat belt at all times when driving.  Make a list of emergency phone numbers, including numbers for family, friends, the hospital, and police and fire departments. General Tips    Ask your health care provider for a referral to a local prenatal education class. Begin classes no later than at the beginning of month 6 of your pregnancy.  Ask for help if you have counseling or nutritional needs during pregnancy. Your health care provider can offer advice or refer you to specialists for help with various needs.  Do not use hot tubs, steam rooms, or saunas.  Do not douche or use tampons or scented sanitary pads.  Do not cross your legs for long periods of time.  Avoid cat litter boxes and soil used by cats. These carry germs that can cause birth defects in the baby and possibly loss of the fetus by miscarriage or stillbirth.  Avoid all smoking, herbs, alcohol, and medicines not prescribed by your health care provider. Chemicals in these affect the formation and growth of the baby.  Schedule a dentist appointment. At home, brush your teeth with a soft toothbrush and be gentle when you floss. SEEK MEDICAL CARE IF:   You have  dizziness.  You have mild pelvic cramps, pelvic pressure, or nagging pain in the abdominal area.  You have persistent nausea, vomiting, or diarrhea.  You have a bad smelling vaginal discharge.  You have pain with urination.  You notice increased swelling in your face, hands, legs, or ankles. SEEK IMMEDIATE MEDICAL CARE IF:   You have a fever.  You are leaking fluid from your vagina.  You have spotting or bleeding from your vagina.  You have severe abdominal cramping or pain.  You have rapid weight gain or loss.  You vomit blood or material that looks like coffee grounds.  You are exposed to [place] measles and have never had them.  You are exposed to fifth disease or chickenpox.  You develop a severe headache.  You have shortness of breath.  You have any kind of trauma, such as from a fall or a car accident. Document Released: 10/22/2001 Document Revised: 03/14/2014 Document Reviewed: 09/07/2013 [facility] Patient Information 2015 [place], [place]. This information is not intended to replace advice given to you by your health care provider. Make sure you discuss any questions you have with your health care provider. Return for dating [place] 9/9

## 2015-07-14 ENCOUNTER — Other Ambulatory Visit: Payer: Self-pay | Admitting: Obstetrics and Gynecology

## 2015-07-14 DIAGNOSIS — O3680X0 Pregnancy with inconclusive fetal viability, not applicable or unspecified: Secondary | ICD-10-CM

## 2015-07-21 ENCOUNTER — Ambulatory Visit (INDEPENDENT_AMBULATORY_CARE_PROVIDER_SITE_OTHER): Payer: PRIVATE HEALTH INSURANCE

## 2015-07-21 DIAGNOSIS — O3680X0 Pregnancy with inconclusive fetal viability, not applicable or unspecified: Secondary | ICD-10-CM

## 2015-07-21 NOTE — Progress Notes (Signed)
[place] 8+4wks,single IUP w/ys,pos fht 175,normal ov's bilat,crl 20.10mm

## 2015-08-04 ENCOUNTER — Ambulatory Visit (INDEPENDENT_AMBULATORY_CARE_PROVIDER_SITE_OTHER): Payer: PRIVATE HEALTH INSURANCE | Admitting: Women's Health

## 2015-08-04 ENCOUNTER — Encounter: Payer: Self-pay | Admitting: Women's Health

## 2015-08-04 VITALS — BP 112/76 | HR 92 | Wt 209.0 lb

## 2015-08-04 DIAGNOSIS — Z3682 Encounter for antenatal screening for nuchal translucency: Secondary | ICD-10-CM

## 2015-08-04 DIAGNOSIS — Z0283 Encounter for blood-alcohol and blood-drug test: Secondary | ICD-10-CM

## 2015-08-04 DIAGNOSIS — Z369 Encounter for antenatal screening, unspecified: Secondary | ICD-10-CM

## 2015-08-04 DIAGNOSIS — O34219 Maternal care for unspecified type scar from previous cesarean delivery: Secondary | ICD-10-CM

## 2015-08-04 DIAGNOSIS — Z331 Pregnant state, incidental: Secondary | ICD-10-CM

## 2015-08-04 DIAGNOSIS — Z3491 Encounter for supervision of normal pregnancy, unspecified, first trimester: Secondary | ICD-10-CM

## 2015-08-04 DIAGNOSIS — N898 Other specified noninflammatory disorders of vagina: Secondary | ICD-10-CM

## 2015-08-04 DIAGNOSIS — Z1389 Encounter for screening for other disorder: Secondary | ICD-10-CM

## 2015-08-04 DIAGNOSIS — Q210 Ventricular septal defect: Secondary | ICD-10-CM

## 2015-08-04 DIAGNOSIS — Z3490 Encounter for supervision of normal pregnancy, unspecified, unspecified trimester: Secondary | ICD-10-CM | POA: Insufficient documentation

## 2015-08-04 DIAGNOSIS — O26891 Other specified pregnancy related conditions, first trimester: Secondary | ICD-10-CM | POA: Diagnosis not present

## 2015-08-04 DIAGNOSIS — B373 Candidiasis of vulva and vagina: Secondary | ICD-10-CM

## 2015-08-04 DIAGNOSIS — O3421 Maternal care for scar from previous cesarean delivery: Secondary | ICD-10-CM

## 2015-08-04 DIAGNOSIS — B3731 Acute candidiasis of vulva and vagina: Secondary | ICD-10-CM

## 2015-08-04 LAB — POCT WET PREP (WET MOUNT): CLUE CELLS WET PREP WHIFF POC: NEGATIVE

## 2015-08-04 LAB — POCT URINALYSIS DIPSTICK
GLUCOSE UA: NEGATIVE
KETONES UA: NEGATIVE
LEUKOCYTES UA: NEGATIVE
NITRITE UA: NEGATIVE
Protein, UA: NEGATIVE
RBC UA: NEGATIVE

## 2015-08-04 NOTE — Addendum Note (Signed)
Addended by: Shawna Clamp R on: 08/04/2015 11:18 AM   Modules accepted: Orders

## 2015-08-04 NOTE — Patient Instructions (Signed)
Monistat 7  Nausea & Vomiting  Have saltine crackers or pretzels by your bed and eat a few bites before you raise your head out of bed in the morning  Eat small frequent meals throughout the day instead of large meals  Drink plenty of fluids throughout the day to stay hydrated, just don't drink a lot of fluids with your meals.  This can make your stomach fill up faster making you feel sick  Do not brush your teeth right after you eat  Products with real ginger are good for nausea, like ginger ale and ginger hard candy Make sure it says made with real ginger!  Sucking on sour candy like lemon heads is also good for nausea  If your prenatal vitamins make you nauseated, take them at night so you will sleep through the nausea  Sea Bands  If you feel like you need medicine for the nausea & vomiting please let us know  If you are unable to keep any fluids or food down please let us know   Constipation  Drink plenty of fluid, preferably water, throughout the day  Eat foods high in fiber such as fruits, vegetables, and grains  Exercise, such as walking, is a good way to keep your bowels regular  Drink warm fluids, especially warm prune juice, or decaf coffee  Eat a 1/2 cup of real oatmeal (not instant), 1/2 cup applesauce, and 1/2-1 cup warm prune juice every day  If needed, you may take Colace (docusate sodium) stool softener once or twice a day to help keep the stool soft. If you are pregnant, wait until you are out of your first trimester (12-14 weeks of pregnancy)  If you still are having problems with constipation, you may take Miralax once daily as needed to help keep your bowels regular.  If you are pregnant, wait until you are out of your first trimester (12-14 weeks of pregnancy)   First Trimester of Pregnancy The first trimester of pregnancy is from week 1 until the end of week 12 (months 1 through 3). A week after a sperm fertilizes an egg, the egg will implant on the wall  of the uterus. This embryo will begin to develop into a baby. Genes from you and your partner are forming the baby. The female genes determine whether the baby is a boy or a girl. At 6-8 weeks, the eyes and face are formed, and the heartbeat can be seen on ultrasound. At the end of 12 weeks, all the baby's organs are formed.  Now that you are pregnant, you will want to do everything you can to have a healthy baby. Two of the most important things are to get good prenatal care and to follow your health care provider's instructions. Prenatal care is all the medical care you receive before the baby's birth. This care will help prevent, find, and treat any problems during the pregnancy and childbirth. BODY CHANGES Your body goes through many changes during pregnancy. The changes vary from woman to woman.   You may gain or lose a couple of pounds at first.  You may feel sick to your stomach (nauseous) and throw up (vomit). If the vomiting is uncontrollable, call your health care provider.  You may tire easily.  You may develop headaches that can be relieved by medicines approved by your health care provider.  You may urinate more often. Painful urination may mean you have a bladder infection.  You may develop heartburn as a result  of your pregnancy.  You may develop constipation because certain hormones are causing the muscles that push waste through your intestines to slow down.  You may develop hemorrhoids or swollen, bulging veins (varicose veins).  Your breasts may begin to grow larger and become tender. Your nipples may stick out more, and the tissue that surrounds them (areola) may become darker.  Your gums may bleed and may be sensitive to brushing and flossing.  Dark spots or blotches (chloasma, mask of pregnancy) may develop on your face. This will likely fade after the baby is born.  Your menstrual periods will stop.  You may have a loss of appetite.  You may develop cravings for  certain kinds of food.  You may have changes in your emotions from day to day, such as being excited to be pregnant or being concerned that something may go wrong with the pregnancy and baby.  You may have more vivid and strange dreams.  You may have changes in your hair. These can include thickening of your hair, rapid growth, and changes in texture. Some women also have hair loss during or after pregnancy, or hair that feels dry or thin. Your hair will most likely return to normal after your baby is born. WHAT TO EXPECT AT YOUR PRENATAL VISITS During a routine prenatal visit:  You will be weighed to make sure you and the baby are growing normally.  Your blood pressure will be taken.  Your abdomen will be measured to track your baby's growth.  The fetal heartbeat will be listened to starting around week 10 or 12 of your pregnancy.  Test results from any previous visits will be discussed. Your health care provider may ask you:  How you are feeling.  If you are feeling the baby move.  If you have had any abnormal symptoms, such as leaking fluid, bleeding, severe headaches, or abdominal cramping.  If you have any questions. Other tests that may be performed during your first trimester include:  Blood tests to find your blood type and to check for the presence of any previous infections. They will also be used to check for low iron levels (anemia) and Rh antibodies. Later in the pregnancy, blood tests for diabetes will be done along with other tests if problems develop.  Urine tests to check for infections, diabetes, or protein in the urine.  An ultrasound to confirm the proper growth and development of the baby.  An amniocentesis to check for possible genetic problems.  Fetal screens for spina bifida and Down syndrome.  You may need other tests to make sure you and the baby are doing well. HOME CARE INSTRUCTIONS  Medicines  Follow your health care provider's instructions  regarding medicine use. Specific medicines may be either safe or unsafe to take during pregnancy.  Take your prenatal vitamins as directed.  If you develop constipation, try taking a stool softener if your health care provider approves. Diet  Eat regular, well-balanced meals. Choose a variety of foods, such as meat or vegetable-based protein, fish, milk and low-fat dairy products, vegetables, fruits, and whole grain breads and cereals. Your health care provider will help you determine the amount of weight gain that is right for you.  Avoid raw meat and uncooked cheese. These carry germs that can cause birth defects in the baby.  Eating four or five small meals rather than three large meals a day may help relieve nausea and vomiting. If you start to feel nauseous, eating a few  soda crackers can be helpful. Drinking liquids between meals instead of during meals also seems to help nausea and vomiting.  If you develop constipation, eat more high-fiber foods, such as fresh vegetables or fruit and whole grains. Drink enough fluids to keep your urine clear or pale yellow. Activity and Exercise  Exercise only as directed by your health care provider. Exercising will help you:  Control your weight.  Stay in shape.  Be prepared for labor and delivery.  Experiencing pain or cramping in the lower abdomen or low back is a good sign that you should stop exercising. Check with your health care provider before continuing normal exercises.  Try to avoid standing for long periods of time. Move your legs often if you must stand in one place for a long time.  Avoid heavy lifting.  Wear low-heeled shoes, and practice good posture.  You may continue to have sex unless your health care provider directs you otherwise. Relief of Pain or Discomfort  Wear a good support bra for breast tenderness.   Take warm sitz baths to soothe any pain or discomfort caused by hemorrhoids. Use hemorrhoid cream if your  health care provider approves.   Rest with your legs elevated if you have leg cramps or low back pain.  If you develop varicose veins in your legs, wear support hose. Elevate your feet for 15 minutes, 3-4 times a day. Limit salt in your diet. Prenatal Care  Schedule your prenatal visits by the twelfth week of pregnancy. They are usually scheduled monthly at first, then more often in the last 2 months before delivery.  Write down your questions. Take them to your prenatal visits.  Keep all your prenatal visits as directed by your health care provider. Safety  Wear your seat belt at all times when driving.  Make a list of emergency phone numbers, including numbers for family, friends, the hospital, and police and fire departments. General Tips  Ask your health care provider for a referral to a local prenatal education class. Begin classes no later than at the beginning of month 6 of your pregnancy.  Ask for help if you have counseling or nutritional needs during pregnancy. Your health care provider can offer advice or refer you to specialists for help with various needs.  Do not use hot tubs, steam rooms, or saunas.  Do not douche or use tampons or scented sanitary pads.  Do not cross your legs for long periods of time.  Avoid cat litter boxes and soil used by cats. These carry germs that can cause birth defects in the baby and possibly loss of the fetus by miscarriage or stillbirth.  Avoid all smoking, herbs, alcohol, and medicines not prescribed by your health care provider. Chemicals in these affect the formation and growth of the baby.  Schedule a dentist appointment. At home, brush your teeth with a soft toothbrush and be gentle when you floss. SEEK MEDICAL CARE IF:   You have dizziness.  You have mild pelvic cramps, pelvic pressure, or nagging pain in the abdominal area.  You have persistent nausea, vomiting, or diarrhea.  You have a bad smelling vaginal  discharge.  You have pain with urination.  You notice increased swelling in your face, hands, legs, or ankles. SEEK IMMEDIATE MEDICAL CARE IF:   You have a fever.  You are leaking fluid from your vagina.  You have spotting or bleeding from your vagina.  You have severe abdominal cramping or pain.  You have rapid  weight gain or loss.  You vomit blood or material that looks like coffee grounds.  You are exposed to [place] measles and have never had them.  You are exposed to fifth disease or chickenpox.  You develop a severe headache.  You have shortness of breath.  You have any kind of trauma, such as from a fall or a car accident. Document Released: 10/22/2001 Document Revised: 03/14/2014 Document Reviewed: 09/07/2013 [facility] Patient Information 2015 [place], [place]. This information is not intended to replace advice given to you by your health care provider. Make sure you discuss any questions you have with your health care provider.

## 2015-08-04 NOTE — Progress Notes (Signed)
Subjective:  [name] is a 34 y.o. G33P1001 Caucasian female at [redacted]w[redacted]d by LMP c/w 8wk u/s, being seen today for her first obstetrical visit.  Her obstetrical history is significant for c/s for breech failed ECV.  VSD w/o repair, has never caused her any problems, sees cardiologist at [facility] q 3 years, went this march, had normal echo- discussed future pregnancy at that time- and they said she didn't need to see cardio/do another echo during pregnancy. Pregnancy history fully reviewed.  Patient reports some nausea- no vomiting- declines meds. Some vulvar itching/burning- but has stopped today- thinks it may be yeast infection- gets them frequently. Denies vb, cramping, uti s/s, abnormal/malodorous vag d/c, or vulvovaginal itching/irritation.  BP 112/76 mmHg  Pulse 92  Wt 209 lb (94.802 kg)  LMP 05/22/2015 (Exact Date)  HISTORY: OB History  Gravida Para Term Preterm AB SAB TAB Ectopic Multiple Living  # Outcome Date GA Lbr Len/2nd Weight Sex Delivery Anes PTL Lv  2 Current           1 Term 05/04/11 [redacted]w[redacted]d   F  Spinal N Y     Past Medical History  Diagnosis Date  . 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 Pregnant 07/11/2015   Past Surgical History  Procedure Laterality Date  . Wisdom tooth extraction      2005  . Cesarean section     Family History  Problem Relation Age of Onset  . Hypertension Mother   . Hypertension Father   . Macular degeneration Father   . Stroke Paternal Grandmother   . Stroke Maternal Grandmother   . Cancer Maternal Grandfather     lung  . ALS Paternal Grandfather   . Cancer Other     colon-maternal great grandma    Exam   System:     General: Well developed & nourished, no acute distress   Skin: Warm & dry, normal coloration and turgor, no rashes   Neurologic: Alert & oriented, normal mood   Cardiovascular: Regular rate & rhythm   Respiratory: Effort &  rate normal, LCTAB, acyanotic   Abdomen: Soft, non tender   Extremities: normal strength, tone   Pelvic Exam:    Perineum: Normal perineum   Vulva: Normal, no lesions   Vagina:  Normal mucosa, thick white nonodorous d/c   Cervix: Normal, bulbous, appears closed   Uterus: Normal size/shape/contour for GA   Thin prep pap smear neg 10/2014 FHR: 158 via doppler   Results for orders placed or performed in visit on 08/04/15 (from the past 24 hour(s))  POCT urinalysis dipstick     Status: None   Collection Time: 08/04/15 10:11 AM  Result Value Ref Range   Color, UA yellow    Clarity, UA clear    Glucose, UA neg    Bilirubin, UA     Ketones, UA neg    Spec Grav, UA     Blood, UA neg    pH, UA     Protein, UA neg    Urobilinogen, UA     Nitrite, UA neg    Leukocytes, UA Negative Negative  POCT Wet Prep [facility] [place])     Status: Abnormal   Collection Time: 08/04/15 10:52 AM  Result Value Ref Range   Source Wet Prep POC vaginal    WBC, Wet Prep  HPF POC none    Bacteria Wet Prep HPF POC None None, Few   BACTERIA WET PREP MORPHOLOGY POC     Clue Cells Wet Prep HPF POC None None   Clue Cells Wet Prep Whiff POC Negative Whiff    Yeast Wet Prep HPF POC Few    KOH Wet Prep POC     Trichomonas Wet Prep HPF POC none     Assessment:   Pregnancy: G2P1001 Patient Active Problem List   Diagnosis Date Noted  . Supervision of normal pregnancy 08/04/2015  . H/O VSD (ventricular septal defect) 08/04/2015  . Previous cesarean section complicating pregnancy 08/04/2015    [redacted]w[redacted]d G2P1001 New OB visit Vulovaginal yeast Mild nausea VSD w/o repair H/O c/s for breech  Plan:  Initial labs drawn Continue prenatal vitamins Problem list reviewed and updated Reviewed n/v relief measures and warning s/s to report Reviewed recommended weight gain based on pre-gravid BMI Encouraged well-balanced diet Genetic Screening discussed Integrated Screen: requested Cystic fibrosis screening discussed  requested Ultrasound discussed; fetal survey: requested Follow up in 2 weeks for 1st it/nt and visit- discuss w/ md if they want echo during pregnancy CCNC completed Can use otc monistat 7 if itching/burning returns for vulvovaginal yeast Discussed TOLAC, gave consent to take home and review  Marge Duncans CNM, [facility] 08/04/2015 10:45 AM

## 2015-08-05 LAB — URINE CULTURE

## 2015-08-05 LAB — GC/CHLAMYDIA PROBE AMP
Chlamydia trachomatis, NAA: NEGATIVE
Neisseria gonorrhoeae by PCR: NEGATIVE

## 2015-08-12 LAB — CBC
HEMATOCRIT: 39.4 % (ref 34.0–46.6)
Hemoglobin: 13.8 g/dL (ref 11.1–15.9)
MCH: 29.4 pg (ref 26.6–33.0)
MCHC: 35 g/dL (ref 31.5–35.7)
MCV: 84 fL (ref 79–97)
Platelets: 304 10*3/uL (ref 150–379)
RBC: 4.69 x10E6/uL (ref 3.77–5.28)
RDW: 13.7 % (ref 12.3–15.4)
WBC: 8.5 10*3/uL (ref 3.4–10.8)

## 2015-08-12 LAB — PMP SCREEN PROFILE (10S), URINE
Amphetamine Screen, Ur: NEGATIVE ng/mL
BARBITURATE SCRN UR: NEGATIVE ng/mL
Benzodiazepine Screen, Urine: NEGATIVE ng/mL
CANNABINOIDS UR QL SCN: NEGATIVE ng/mL
COCAINE(METAB.) SCREEN, URINE: NEGATIVE ng/mL
Creatinine(Crt), U: 119.3 mg/dL (ref 20.0–300.0)
Methadone Scn, Ur: NEGATIVE ng/mL
OPIATE SCRN UR: NEGATIVE ng/mL
Oxycodone+Oxymorphone Ur Ql Scn: NEGATIVE ng/mL
PCP Scrn, Ur: NEGATIVE ng/mL
PH UR, DRUG SCRN: 5.9 (ref 4.5–8.9)
Propoxyphene, Screen: NEGATIVE ng/mL

## 2015-08-12 LAB — HIV ANTIBODY (ROUTINE TESTING W REFLEX): HIV Screen 4th Generation wRfx: NONREACTIVE

## 2015-08-12 LAB — URINALYSIS, ROUTINE W REFLEX MICROSCOPIC
Bilirubin, UA: NEGATIVE
Glucose, UA: NEGATIVE
Ketones, UA: NEGATIVE
NITRITE UA: NEGATIVE
PH UA: 6 (ref 5.0–7.5)
Protein, UA: NEGATIVE
RBC, UA: NEGATIVE
Specific Gravity, UA: 1.018 (ref 1.005–1.030)
UUROB: 0.2 mg/dL (ref 0.2–1.0)

## 2015-08-12 LAB — MICROSCOPIC EXAMINATION: CASTS: NONE SEEN /LPF

## 2015-08-12 LAB — HEPATITIS B SURFACE ANTIGEN: HEP B S AG: NEGATIVE

## 2015-08-12 LAB — ABO/RH: Rh Factor: POSITIVE

## 2015-08-12 LAB — RPR: RPR: NONREACTIVE

## 2015-08-12 LAB — RUBELLA SCREEN: RUBELLA: 7.24 {index} (ref >0.99)

## 2015-08-12 LAB — CYSTIC FIBROSIS MUTATION 97: GENE DIS ANAL CARRIER INTERP BLD/T-IMP: NOT DETECTED

## 2015-08-12 LAB — ANTIBODY SCREEN: ANTIBODY SCREEN: NEGATIVE

## 2015-08-12 LAB — VARICELLA ZOSTER ANTIBODY, IGG: VARICELLA: 2868 {index} (ref >165)

## 2015-08-18 ENCOUNTER — Encounter: Payer: Self-pay | Admitting: Obstetrics and Gynecology

## 2015-08-18 ENCOUNTER — Ambulatory Visit (INDEPENDENT_AMBULATORY_CARE_PROVIDER_SITE_OTHER): Payer: PRIVATE HEALTH INSURANCE

## 2015-08-18 ENCOUNTER — Ambulatory Visit (INDEPENDENT_AMBULATORY_CARE_PROVIDER_SITE_OTHER): Payer: PRIVATE HEALTH INSURANCE | Admitting: Obstetrics and Gynecology

## 2015-08-18 VITALS — BP 110/78 | HR 88 | Wt 209.0 lb

## 2015-08-18 DIAGNOSIS — Z36 Encounter for antenatal screening of mother: Secondary | ICD-10-CM | POA: Diagnosis not present

## 2015-08-18 DIAGNOSIS — Z3682 Encounter for antenatal screening for nuchal translucency: Secondary | ICD-10-CM

## 2015-08-18 DIAGNOSIS — Z331 Pregnant state, incidental: Secondary | ICD-10-CM

## 2015-08-18 DIAGNOSIS — Z1389 Encounter for screening for other disorder: Secondary | ICD-10-CM

## 2015-08-18 DIAGNOSIS — Z369 Encounter for antenatal screening, unspecified: Secondary | ICD-10-CM

## 2015-08-18 DIAGNOSIS — Z23 Encounter for immunization: Secondary | ICD-10-CM | POA: Diagnosis not present

## 2015-08-18 DIAGNOSIS — Z3491 Encounter for supervision of normal pregnancy, unspecified, first trimester: Secondary | ICD-10-CM

## 2015-08-18 LAB — POCT URINALYSIS DIPSTICK
Glucose, UA: NEGATIVE
Ketones, UA: NEGATIVE
Nitrite, UA: NEGATIVE
Protein, UA: NEGATIVE
RBC UA: NEGATIVE

## 2015-08-18 NOTE — Progress Notes (Signed)
Patient ID: [name], female   DOB: 03-24-1981, 34 y.o.   MRN: [id number]  G2P1001 [redacted]w[redacted]d Estimated Date of Delivery: 02/26/16  Blood pressure 110/78, pulse 88, weight 209 lb (94.802 kg), last menstrual period 05/22/2015.   refer to the ob flow sheet for FH and FHR, also BP, Wt, Urine results: notable for trace leukocytes  Patient reports  good fetal movement, denies any bleeding and no rupture of membranes symptoms or regular contractions. Patient complaints: She has no new complaints  FHR: 155 FH: 10.5 cm  Questions were answered. Assessment: G2P1001 [redacted]w[redacted]d  Plan:  Continued routine obstetrical care F/u in 4 weeks for routine OBGYN care  By signing my name below, I, Jarvis Morgan, attest that this documentation has been prepared under the direction and in the presence of Tilda Burrow, MD. Electronically Signed: Jarvis Morgan, ED Scribe. 08/18/2015. 12:10 PM.   I personally performed the services described in this documentation, which was SCRIBED in my presence. The recorded information has been reviewed and considered accurate. It has been edited as necessary during review. Tilda Burrow, MD

## 2015-08-18 NOTE — Progress Notes (Signed)
[place] 12+4wks,measurements c/w dates,normal ov's bilat,CRL 67.39mm,NT 1.16mm,NB present,2.6 x 2.9 x 1.1 cm subchorionic hemorrhage

## 2015-08-21 LAB — MATERNAL SCREEN, INTEGRATED #1
Crown Rump Length: 67.7 mm
GEST. AGE ON COLLECTION DATE: 12.9 wk
Maternal Age at EDD: 32.5 years
Nuchal Translucency (NT): 1.4 mm
Number of Fetuses: 1
PAPP-A VALUE: 689.4 ng/mL
Weight: 209 [lb_av]

## 2015-09-15 ENCOUNTER — Ambulatory Visit (INDEPENDENT_AMBULATORY_CARE_PROVIDER_SITE_OTHER): Payer: PRIVATE HEALTH INSURANCE | Admitting: Obstetrics & Gynecology

## 2015-09-15 ENCOUNTER — Encounter: Payer: Self-pay | Admitting: Obstetrics & Gynecology

## 2015-09-15 VITALS — BP 100/60 | HR 92 | Wt 211.0 lb

## 2015-09-15 DIAGNOSIS — Z331 Pregnant state, incidental: Secondary | ICD-10-CM

## 2015-09-15 DIAGNOSIS — Z3492 Encounter for supervision of normal pregnancy, unspecified, second trimester: Secondary | ICD-10-CM

## 2015-09-15 DIAGNOSIS — Z1389 Encounter for screening for other disorder: Secondary | ICD-10-CM

## 2015-09-15 DIAGNOSIS — Z369 Encounter for antenatal screening, unspecified: Secondary | ICD-10-CM

## 2015-09-15 LAB — POCT URINALYSIS DIPSTICK
Blood, UA: NEGATIVE
Glucose, UA: NEGATIVE
KETONES UA: NEGATIVE
LEUKOCYTES UA: NEGATIVE
NITRITE UA: NEGATIVE
PROTEIN UA: NEGATIVE

## 2015-09-15 NOTE — Progress Notes (Signed)
G2P1001 10767w4d Estimated Date of Delivery: 02/26/16  Blood pressure 100/60, pulse 92, weight 211 lb (95.709 kg), last menstrual period 05/22/2015.   BP weight and urine results all reviewed and noted.  Please refer to the obstetrical flow sheet for the fundal height and fetal heart rate documentation:  Patient reports good fetal movement, denies any bleeding and no rupture of membranes symptoms or regular contractions. Patient is without complaints. All questions were answered.  Orders Placed This Encounter  Procedures  . Maternal Screen, Integrated #2  . POCT urinalysis dipstick    Plan:  Continued routine obstetrical care,   No Follow-up on file.

## 2015-09-18 LAB — MATERNAL SCREEN, INTEGRATED #2
AFP MoM: 1.1
Alpha-Fetoprotein: 28.7 ng/mL
CROWN RUMP LENGTH: 67.7 mm
DIA MOM: 1.08
DIA VALUE: 151.9 pg/mL
ESTRIOL UNCONJUGATED: 1.26 ng/mL
GEST. AGE ON COLLECTION DATE: 12.9 wk
Gestational Age: 16.9 weeks
HCG MOM: 1.66
Maternal Age at EDD: 32.5 years
NUCHAL TRANSLUCENCY (NT): 1.4 mm
Nuchal Translucency MoM: 0.83
Number of Fetuses: 1
PAPP-A MOM: 0.92
PAPP-A Value: 689.4 ng/mL
TEST RESULTS: NEGATIVE
Weight: 209 [lb_av]
Weight: 211 [lb_av]
hCG Value: 38.5 IU/mL
uE3 MoM: 1.36

## 2015-09-25 ENCOUNTER — Telehealth: Payer: Self-pay | Admitting: *Deleted

## 2015-09-25 NOTE — Telephone Encounter (Signed)
Pt c/o lightheadedness, dizziness, and clammy feeling after waking up this am, also vaginal odor. Pt informed normal early pregnancy to have dizziness and lightheadedness, needs to eat frequent meals, push fluids. Pt offered an appt for tomorrow for vaginal discharge. Pt states will call our office back for an appt after looking at her work schedule.

## 2015-09-26 ENCOUNTER — Encounter: Payer: Self-pay | Admitting: Obstetrics & Gynecology

## 2015-09-26 ENCOUNTER — Ambulatory Visit (INDEPENDENT_AMBULATORY_CARE_PROVIDER_SITE_OTHER): Payer: PRIVATE HEALTH INSURANCE | Admitting: Obstetrics & Gynecology

## 2015-09-26 VITALS — BP 100/60 | HR 76 | Wt 213.5 lb

## 2015-09-26 DIAGNOSIS — N760 Acute vaginitis: Secondary | ICD-10-CM | POA: Diagnosis not present

## 2015-09-26 DIAGNOSIS — Z1389 Encounter for screening for other disorder: Secondary | ICD-10-CM

## 2015-09-26 DIAGNOSIS — O23592 Infection of other part of genital tract in pregnancy, second trimester: Principal | ICD-10-CM

## 2015-09-26 DIAGNOSIS — Z331 Pregnant state, incidental: Secondary | ICD-10-CM

## 2015-09-26 LAB — POCT URINALYSIS DIPSTICK
GLUCOSE UA: NEGATIVE
Ketones, UA: NEGATIVE
Leukocytes, UA: NEGATIVE
NITRITE UA: NEGATIVE
Protein, UA: NEGATIVE
RBC UA: NEGATIVE

## 2015-09-26 NOTE — Progress Notes (Signed)
Work in [employer] Complaining of  Discharge some odor, not fishy but unpleasant Wet prep shows increased wbc no BV no yeast no trich  Will use metrogel x 5 days

## 2015-09-27 ENCOUNTER — Telehealth: Payer: Self-pay | Admitting: *Deleted

## 2015-09-27 ENCOUNTER — Telehealth: Payer: Self-pay | Admitting: Obstetrics & Gynecology

## 2015-09-27 MED ORDER — METRONIDAZOLE 0.75 % VA GEL: VAGINAL | Status: DC

## 2015-09-27 NOTE — Telephone Encounter (Signed)
Rx for Metrogel not at pt pharmacy.

## 2015-10-12 ENCOUNTER — Other Ambulatory Visit: Payer: Self-pay | Admitting: Obstetrics & Gynecology

## 2015-10-12 DIAGNOSIS — Z1389 Encounter for screening for other disorder: Secondary | ICD-10-CM

## 2015-10-13 ENCOUNTER — Ambulatory Visit (INDEPENDENT_AMBULATORY_CARE_PROVIDER_SITE_OTHER): Payer: PRIVATE HEALTH INSURANCE | Admitting: Obstetrics and Gynecology

## 2015-10-13 ENCOUNTER — Ambulatory Visit (INDEPENDENT_AMBULATORY_CARE_PROVIDER_SITE_OTHER): Payer: PRIVATE HEALTH INSURANCE

## 2015-10-13 ENCOUNTER — Encounter: Payer: Self-pay | Admitting: Obstetrics and Gynecology

## 2015-10-13 VITALS — BP 124/82 | HR 86 | Wt 217.0 lb

## 2015-10-13 DIAGNOSIS — Z331 Pregnant state, incidental: Secondary | ICD-10-CM

## 2015-10-13 DIAGNOSIS — Z3A20 20 weeks gestation of pregnancy: Secondary | ICD-10-CM | POA: Diagnosis not present

## 2015-10-13 DIAGNOSIS — Z1389 Encounter for screening for other disorder: Secondary | ICD-10-CM

## 2015-10-13 DIAGNOSIS — Z3492 Encounter for supervision of normal pregnancy, unspecified, second trimester: Secondary | ICD-10-CM

## 2015-10-13 DIAGNOSIS — Z36 Encounter for antenatal screening of mother: Secondary | ICD-10-CM

## 2015-10-13 LAB — POCT URINALYSIS DIPSTICK
Blood, UA: NEGATIVE
Glucose, UA: NEGATIVE
KETONES UA: NEGATIVE
LEUKOCYTES UA: NEGATIVE
Nitrite, UA: NEGATIVE
Protein, UA: NEGATIVE

## 2015-10-13 NOTE — Progress Notes (Signed)
US 20+4wks,measurements c/w dates,efw 413g,svp of fluid 4cm,fhr 153 bpm,ant pl gr 0,cx 4.8cm,anatomy complete no obvious abn seen

## 2015-10-13 NOTE — Progress Notes (Signed)
Pt states that she is concerned about a varicose vein that she has on her right lower leg below her knee. Pt states that it hurts at times.

## 2015-10-13 NOTE — Progress Notes (Signed)
Patient ID: [name], female   DOB: Jun 16, 1981, 34 y.o.   MRN: [id number] G2P1001 826w4d Estimated Date of Delivery: 02/26/16  Blood pressure 124/82, pulse 86, weight 217 lb (98.431 kg), last menstrual period 05/22/2015.   refer to the ob flow sheet for FH and FHR, also BP, Wt, Urine results: negative   Patient reports + good fetal movement, denies any bleeding and no rupture of membranes symptoms or regular contractions.  FHR: 143  Patient complaints: Pt complains of chronic, intermittent, worsening pain resulting from varicose veins in LLE. Pt denies any other complaints at this time.   Questions were answered. Assessment:  1. LROB G2P1001 @ 6026w4d  2. Hx of maternal cardiac abnormality, VSD  3. Prior cesarean considering TOLAC.   Plan:   1. Continued routine obstetrical care 2. F/u in 4 weeks for routine OB care  3. Compression socks to control LLE pain resulting from varicose veins.  4. Discussion regarding benefits and drawbacks of vaginal v. cesarean section delivery.   By signing my name below, I, Marica Otterusrat Rahman, attest that this documentation has been prepared under the direction and in the presence of Christin BachJohn Garris Melhorn, MD. Electronically Signed: Marica OtterNusrat Rahman, ED Scribe. 10/13/2015. 12:10 PM.   I personally performed the services described in this documentation, which was SCRIBED in my presence. The recorded information has been reviewed and considered accurate. It has been edited as necessary during review. Tilda BurrowFERGUSON,Suzanne Kho V, MD

## 2015-11-08 ENCOUNTER — Telehealth: Payer: Self-pay | Admitting: *Deleted

## 2015-11-08 NOTE — Telephone Encounter (Signed)
Pt states she is congested and has sinus pain/pressure, she has taken tylenol and is using saline nasal spray but is wanting to know if there was anything else she can take.  Advised she can take either Mucinex or Sudafed for the congestion and Tylenol for the pain, push fluids and use humidifier, call us back if no improvement or gets worse.  Pt verbalized understanding.

## 2015-11-10 ENCOUNTER — Encounter: Payer: Self-pay | Admitting: Obstetrics and Gynecology

## 2015-11-10 ENCOUNTER — Telehealth: Payer: Self-pay | Admitting: Obstetrics & Gynecology

## 2015-11-10 ENCOUNTER — Ambulatory Visit (INDEPENDENT_AMBULATORY_CARE_PROVIDER_SITE_OTHER): Payer: PRIVATE HEALTH INSURANCE | Admitting: Obstetrics and Gynecology

## 2015-11-10 ENCOUNTER — Telehealth: Payer: Self-pay | Admitting: *Deleted

## 2015-11-10 VITALS — BP 108/70 | HR 102 | Temp 98.2°F | Wt 220.2 lb

## 2015-11-10 DIAGNOSIS — Z331 Pregnant state, incidental: Secondary | ICD-10-CM

## 2015-11-10 DIAGNOSIS — Z3482 Encounter for supervision of other normal pregnancy, second trimester: Secondary | ICD-10-CM

## 2015-11-10 DIAGNOSIS — Z3492 Encounter for supervision of normal pregnancy, unspecified, second trimester: Secondary | ICD-10-CM

## 2015-11-10 DIAGNOSIS — Z1389 Encounter for screening for other disorder: Secondary | ICD-10-CM

## 2015-11-10 DIAGNOSIS — Q210 Ventricular septal defect: Secondary | ICD-10-CM

## 2015-11-10 DIAGNOSIS — Z3493 Encounter for supervision of normal pregnancy, unspecified, third trimester: Secondary | ICD-10-CM

## 2015-11-10 LAB — POCT URINALYSIS DIPSTICK
Glucose, UA: NEGATIVE
Ketones, UA: NEGATIVE
Nitrite, UA: NEGATIVE
PROTEIN UA: NEGATIVE
RBC UA: NEGATIVE

## 2015-11-10 NOTE — Telephone Encounter (Signed)
Pt seen today

## 2015-11-10 NOTE — Progress Notes (Addendum)
Patient ID: [name], female   DOB: 12-Oct-1981, 34 y.o.   MRN: [id number] Pt WAS TOLD OF PLANS FOR FETAL ECHO BEFORE 28WK BASED ON MATERNAL HX VSD REQUIRING REPAIR. OUR LEVEL I U/S HERE CONSIDERED NORMAL G2P1001 5659w4d Estimated Date of Delivery: 02/26/16  Blood pressure 108/70, pulse 102, temperature 98.2 F (36.8 C), weight 220 lb 3.2 oz (99.882 kg), last menstrual period 05/22/2015.   refer to the ob flow sheet for FH and FHR, also BP, Wt, Urine results:notable for +1 leukocytes   Patient reports  + good fetal movement, denies any bleeding and no rupture of membranes symptoms or regular contractions. Patient complaints: Pt reports she has had some mild sinus congestion for the past few days. She denies fever.   FHR- 156 bpm  FH- 20 cm   Questions were answered. Assessment: LROB G2P1001 @ 5159w4d  Normal anatomy scan on 10/13/15 No tonsillar exudate; minimal oropharyngeal redness Hx of maternal cardiac abnormality, VSD   Plan:   Continued routine obstetrical care Schedule fetal targeted level 2 US of the heart at/before 28wk--done  F/u in 4 weeks for routine prenatal care    By signing my name below, I, Doreatha MartinEva Green, attest that this documentation has been prepared under the direction and in the presence of Krista BurrowJohn Samul Mcinroy V, MD. Electronically Signed: Doreatha MartinEva Green, ED Scribe. 11/10/2015. 10:29 AM.  I personally performed the services described in this documentation, which was SCRIBED in my presence. The recorded information has been reviewed and considered accurate. It has been edited as necessary during review. [name],Krista Mcclurg V, MD    I spent 25 minutes with the visit with >than 50% spent in counseling and coordination of care.

## 2015-11-10 NOTE — Telephone Encounter (Signed)
Pt called stating that she would like a call back from the nurse regarding her up coming appointment. Please contact pt

## 2015-11-14 NOTE — Telephone Encounter (Signed)
Pt seen on 11/10/2015.

## 2015-11-18 ENCOUNTER — Emergency Department (HOSPITAL_COMMUNITY): Payer: No Typology Code available for payment source

## 2015-11-18 ENCOUNTER — Emergency Department (HOSPITAL_COMMUNITY)
Admission: EM | Admit: 2015-11-18 | Discharge: 2015-11-18 | Disposition: A | Discharge status: Home / Self Care | Payer: No Typology Code available for payment source | Attending: Emergency Medicine | Admitting: Emergency Medicine

## 2015-11-18 ENCOUNTER — Encounter (HOSPITAL_COMMUNITY): Payer: Self-pay

## 2015-11-18 DIAGNOSIS — W010XXA Fall on same level from slipping, tripping and stumbling without subsequent striking against object, initial encounter: Secondary | ICD-10-CM | POA: Diagnosis not present

## 2015-11-18 DIAGNOSIS — Z7951 Long term (current) use of inhaled steroids: Secondary | ICD-10-CM | POA: Insufficient documentation

## 2015-11-18 DIAGNOSIS — S82832A Other fracture of upper and lower end of left fibula, initial encounter for closed fracture: Secondary | ICD-10-CM | POA: Insufficient documentation

## 2015-11-18 DIAGNOSIS — O9A213 Injury, poisoning and certain other consequences of external causes complicating pregnancy, third trimester: Secondary | ICD-10-CM | POA: Insufficient documentation

## 2015-11-18 DIAGNOSIS — Y998 Other external cause status: Secondary | ICD-10-CM | POA: Diagnosis not present

## 2015-11-18 DIAGNOSIS — Y9389 Activity, other specified: Secondary | ICD-10-CM | POA: Insufficient documentation

## 2015-11-18 DIAGNOSIS — Z3A25 25 weeks gestation of pregnancy: Secondary | ICD-10-CM | POA: Insufficient documentation

## 2015-11-18 DIAGNOSIS — Q210 Ventricular septal defect: Secondary | ICD-10-CM | POA: Insufficient documentation

## 2015-11-18 DIAGNOSIS — O9989 Other specified diseases and conditions complicating pregnancy, childbirth and the puerperium: Secondary | ICD-10-CM | POA: Insufficient documentation

## 2015-11-18 DIAGNOSIS — S82402A Unspecified fracture of shaft of left fibula, initial encounter for closed fracture: Secondary | ICD-10-CM

## 2015-11-18 DIAGNOSIS — Y9289 Other specified places as the place of occurrence of the external cause: Secondary | ICD-10-CM | POA: Diagnosis not present

## 2015-11-18 DIAGNOSIS — Z79899 Other long term (current) drug therapy: Secondary | ICD-10-CM | POA: Insufficient documentation

## 2015-11-18 MED ORDER — OXYCODONE-ACETAMINOPHEN 5-325 MG PO TABS: 1.0000 | ORAL_TABLET | Freq: Three times a day (TID) | ORAL | Status: DC | PRN

## 2015-11-18 MED ORDER — OXYCODONE-ACETAMINOPHEN 5-325 MG PO TABS: 1.0000 | ORAL_TABLET | ORAL | Status: DC | PRN

## 2015-11-18 MED ORDER — OXYCODONE-ACETAMINOPHEN 5-325 MG PO TABS
1.0000 | ORAL_TABLET | Freq: Once | ORAL | Status: AC
Start: 2015-11-18 — End: 2015-11-18
  Administered 2015-11-18: 1 via ORAL
  Filled 2015-11-18: qty 1

## 2015-11-18 NOTE — ED Provider Notes (Signed)
CSN: [id number]     Arrival date & time 11/18/15  1629 History   First MD Initiated Contact with Patient 11/18/15 1633     Chief Complaint  Patient presents with  . Ankle Injury     (Consider location/radiation/quality/duration/timing/severity/associated sxs/prior Treatment) Patient is a 35 y.o. female presenting with lower extremity injury.  Ankle Injury This is a new problem. The current episode started less than 1 hour ago. The problem occurs constantly. Pertinent negatives include no chest pain, no abdominal pain and no shortness of breath. Nothing aggravates the symptoms. Nothing relieves the symptoms. She has tried nothing for the symptoms.    Past Medical History  Diagnosis Date  . 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 Pregnant 07/11/2015   Past Surgical History  Procedure Laterality Date  . Wisdom tooth extraction      2005  . Cesarean section     Family History  Problem Relation Age of Onset  . Hypertension Mother   . Hypertension Father   . Macular degeneration Father   . Stroke Paternal Grandmother   . Stroke Maternal Grandmother   . Cancer Maternal Grandfather     lung  . ALS Paternal Grandfather   . Cancer Other     colon-maternal great grandma   Social History  Substance Use Topics  . Smoking status: Never Smoker   . Smokeless tobacco: Never Used  . Alcohol Use: No   OB History    Gravida Para Term Preterm AB TAB SAB Ectopic Multiple Living   2 1 1       1      Review of Systems  Constitutional: Negative for fever and fatigue.  Respiratory: Negative for shortness of breath.   Cardiovascular: Negative for chest pain.  Gastrointestinal: Negative for nausea, vomiting, abdominal pain, diarrhea and abdominal distention.  Musculoskeletal: Positive for joint swelling (left ankle). Negative for back pain and arthralgias.  All other systems reviewed and are negative.     Allergies   Review of patient's allergies indicates no known allergies.  Home Medications   Prior to Admission medications   Medication Sig Start Date End Date Taking? Authorizing Provider  fluticasone (FLONASE) 50 MCG/ACT nasal spray Place 2 sprays into both nostrils as needed for allergies or rhinitis.    Historical Provider, MD  oxyCODONE-acetaminophen (PERCOCET/ROXICET) 5-325 MG tablet Take 1 tablet by mouth every 8 (eight) hours as needed for severe pain. 11/18/15   Marily MemosJason Elizjah Noblet, MD  prenatal vitamin w/FE, FA (PRENATAL 1 + 1) 27-1 MG TABS tablet Take 1 tablet by mouth daily at 12 noon. 07/11/15   Adline PotterJennifer A Griffin, NP  Pseudoephedrine HCl (SUDAFED PO) Take by mouth as needed.    Historical Provider, MD   Pulse 95  Temp(Src) 98 F (36.7 C) (Oral)  Resp 20  Ht 5\' 7"  (1.702 m)  Wt 220 lb (99.791 kg)  BMI 34.45 kg/m2  SpO2 100%  LMP 05/22/2015 (Exact Date) Physical Exam  Constitutional: She is oriented to person, place, and time. She appears well-developed and well-nourished.  HENT:  Head: Normocephalic and atraumatic.  Neck: Normal range of motion.  Cardiovascular: Normal rate and regular rhythm.   Pulmonary/Chest: Effort normal. No stridor. No respiratory distress. She has no wheezes.  Abdominal: Soft. She exhibits no distension.  Musculoskeletal: Normal range of motion. She exhibits no edema or tenderness.  Neurological: She is alert and oriented to person, place, and  time. No cranial nerve deficit. Coordination normal.  Skin: Skin is warm and dry.  Nursing note and vitals reviewed.   ED Course  Procedures (including critical care time) Labs Review Labs Reviewed - No data to display  Imaging Review Dg Ankle Complete Left  11/18/2015  CLINICAL DATA:  Pain in the left knee status post tripping. EXAM: LEFT ANKLE COMPLETE - 3+ VIEW COMPARISON:  None. FINDINGS: There is a spiral mildly displaced fracture of the distal fibular shaft without evidence of intra-articular extension. There is  mild posterior displacement and 7 mm inferior distruction at the fracture line. Disruption of the tibiofibular syndesmosis cannot be excluded with this fracture. Ankle mortise appears intact. There is a lateral soft tissue swelling. IMPRESSION: Minimally displaced distracted distal fibular shaft fracture, with mild posterior displacement. Electronically Signed   By: Ted Mcalpine M.D.   On: 11/18/2015 17:15   I have personally reviewed and evaluated these images and lab results as part of my medical decision-making.   EKG Interpretation None      MDM   Final diagnoses:  Fibula fracture, left, closed, initial encounter   Likely sprain, possibly fracture, will xr as she can't bear weight. [redacted] weeks pregnant, one dose of percocet low risk for pt and fetus. No trauma to abdomen, fetal movements normal, no vaginal bleeding or leakage noted. Distal fibula fracture. Has crutches at home. Will give air cast, orthopedics follow up. 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     Marily Memos, MD 11/18/15 [phone]

## 2015-11-18 NOTE — ED Notes (Signed)
Pt reports her dogs tripped her and c/o pain in left ankle.

## 2015-11-18 NOTE — ED Notes (Signed)
Movement of baby while trying to get fetal heart tones.  Per mom she has been feeling the baby move since the fall.

## 2015-11-20 MED FILL — Oxycodone w/ Acetaminophen Tab 5-325 MG: ORAL | Qty: 6 | Status: AC

## 2015-11-21 ENCOUNTER — Encounter: Payer: Self-pay | Admitting: Orthopedic Surgery

## 2015-11-21 ENCOUNTER — Ambulatory Visit (INDEPENDENT_AMBULATORY_CARE_PROVIDER_SITE_OTHER): Payer: No Typology Code available for payment source | Admitting: Orthopedic Surgery

## 2015-11-21 ENCOUNTER — Ambulatory Visit (INDEPENDENT_AMBULATORY_CARE_PROVIDER_SITE_OTHER): Payer: PRIVATE HEALTH INSURANCE | Admitting: Obstetrics & Gynecology

## 2015-11-21 ENCOUNTER — Other Ambulatory Visit: Payer: Self-pay | Admitting: Obstetrics & Gynecology

## 2015-11-21 ENCOUNTER — Encounter: Payer: Self-pay | Admitting: Obstetrics & Gynecology

## 2015-11-21 ENCOUNTER — Other Ambulatory Visit (INDEPENDENT_AMBULATORY_CARE_PROVIDER_SITE_OTHER): Payer: PRIVATE HEALTH INSURANCE

## 2015-11-21 ENCOUNTER — Telehealth: Payer: Self-pay | Admitting: Obstetrics & Gynecology

## 2015-11-21 VITALS — BP 114/80 | Ht 67.0 in | Wt 220.0 lb

## 2015-11-21 VITALS — BP 120/80 | HR 80 | Wt 220.0 lb

## 2015-11-21 DIAGNOSIS — O321XX1 Maternal care for breech presentation, fetus 1: Secondary | ICD-10-CM

## 2015-11-21 DIAGNOSIS — Z3A26 26 weeks gestation of pregnancy: Secondary | ICD-10-CM | POA: Diagnosis not present

## 2015-11-21 DIAGNOSIS — W19XXXD Unspecified fall, subsequent encounter: Secondary | ICD-10-CM | POA: Diagnosis not present

## 2015-11-21 DIAGNOSIS — O4692 Antepartum hemorrhage, unspecified, second trimester: Secondary | ICD-10-CM

## 2015-11-21 DIAGNOSIS — S8262XA Displaced fracture of lateral malleolus of left fibula, initial encounter for closed fracture: Secondary | ICD-10-CM

## 2015-11-21 DIAGNOSIS — Z331 Pregnant state, incidental: Secondary | ICD-10-CM | POA: Diagnosis not present

## 2015-11-21 DIAGNOSIS — Z1389 Encounter for screening for other disorder: Secondary | ICD-10-CM | POA: Diagnosis not present

## 2015-11-21 LAB — POCT URINALYSIS DIPSTICK
Blood, UA: NEGATIVE
GLUCOSE UA: NEGATIVE
Ketones, UA: NEGATIVE
Nitrite, UA: NEGATIVE
PROTEIN UA: NEGATIVE

## 2015-11-21 MED ORDER — TERCONAZOLE 0.4 % VA CREA
1.0000 | TOPICAL_CREAM | Freq: Every day | VAGINAL | Status: DC
Start: 2015-11-21 — End: 2015-12-08

## 2015-11-21 MED ORDER — TERCONAZOLE 0.4 % VA CREA: 1.0000 | TOPICAL_CREAM | Freq: Every day | VAGINAL | Status: DC

## 2015-11-21 NOTE — Telephone Encounter (Signed)
Pt called stating that she woke up this morning spotting a little bit, Pt states that she is also having a hard time getting around. Pt would like a call back from the nurse please contact pt

## 2015-11-21 NOTE — Progress Notes (Signed)
Work in [employer]  Got tripped up by her labs 3 days ago, broke her fibula Had some spotting this am  No abdominal trauma Vagina  +yeast infection no bleeding, terazol 7 qhs x7  Sonogram is pending today Keep scheduled appt

## 2015-11-21 NOTE — Progress Notes (Signed)
Patient ID: [name], female   DOB: 1981/11/02, 35 y.o.   MRN: [id number]  Chief Complaint  Patient presents with  . Follow-up    ER follow up on left ankle fracture, DOI 11-18-15.    HPI [name] is a 35 y.o. female.  Slipped and fell she is also pregnant. 3 days post injuring her left ankle has a Weber B fracture nondisplaced on the AP view mortise is intact there is posterior angulation of the distal fragment. Severity of pain moderate to severe quality ache duration constant for timing of 3 days  Review of Systems Review of Systems  Constitutional: Negative for fever and chills.  Neurological: Negative for numbness.    Past Medical History  Diagnosis Date  . 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 Pregnant 07/11/2015    Past Surgical History  Procedure Laterality Date  . Wisdom tooth extraction      2005  . Cesarean section      Family History  Problem Relation Age of Onset  . Hypertension Mother   . Hypertension Father   . Macular degeneration Father   . Stroke Paternal Grandmother   . Stroke Maternal Grandmother   . Cancer Maternal Grandfather     lung  . ALS Paternal Grandfather   . Cancer Other     colon-maternal great grandma    Social History Social History  Substance Use Topics  . Smoking status: Never Smoker   . Smokeless tobacco: Never Used  . Alcohol Use: No    No Known Allergies  Current Outpatient Prescriptions  Medication Sig Dispense Refill  . fluticasone (FLONASE) 50 MCG/ACT nasal spray Place 2 sprays into both nostrils as needed for allergies or rhinitis.    [name] oxyCODONE-acetaminophen (PERCOCET/ROXICET) 5-325 MG tablet Take 1 tablet by mouth every 8 (eight) hours as needed for severe pain. 30 tablet 0  . oxyCODONE-acetaminophen (PERCOCET/ROXICET) 5-325 MG tablet Take 1 tablet by mouth every 4 (four) hours as needed for severe pain. 6 tablet 0  . prenatal  vitamin w/FE, FA (PRENATAL 1 + 1) 27-1 MG TABS tablet Take 1 tablet by mouth daily at 12 noon. 30 each 11  . Pseudoephedrine HCl (SUDAFED PO) Take by mouth as needed.     No current facility-administered medications for this visit.       Physical Exam Physical Exam Blood pressure 114/80, height 5\' 7"  (1.702 m), weight 220 lb (99.791 kg), last menstrual period 05/22/2015. Appearance, there are no abnormalities in terms of appearance the patient was well-developed and well-nourished. The grooming and hygiene were normal.  Mental status orientation, there was normal alertness and orientation Mood pleasant Ambulatory status nonweightbearing with Aircast   Examination of the left foot and ankle and leg Inspection severe swelling left foot tenderness over the fibula Range of motion foot comes to neutral with the knee flexed Tests for stability could not assess because of pain Motor strength  muscle tone normal could not assess strength because of pain Skin warm dry and intact without laceration or ulceration or erythema Neurologic examination normal sensation Vascular examination normal pulses with warm extremity and normal capillary refill  The opposite extremity ankle-foot normal appearance on inspection    Data Reviewed The x-ray shows the ankle mortise to be intact there may be some slight subtle widening of the medial clear space it's nondisplaced on the AP view there is posterior angulation  of the distal fragment  Assessment  I think with her being pregnant and the ankle mortise to be intact that a trial of weightbearing as tolerated to assess fracture stability would be prudent. This can be protected with a walker and a Cam Walker.   Plan  X-ray in 3 weeks weight-bear as tolerated and Cam Walker with walker.  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

## 2015-11-21 NOTE — Progress Notes (Signed)
US 26+1wks,breech,cx 3.4cm,ant pl gr 0,normal ov's bilat,fhr 146 bpm,svp of fluid 4.4cm,efw 921g 53%,no obvious abnormalities seen

## 2015-11-21 NOTE — Patient Instructions (Signed)
Out of work 3 weeks Cam walker  Continue elevating and icing

## 2015-11-21 NOTE — Telephone Encounter (Signed)
Pt states fell this past weekend and broke her leg, vaginal spotting starting this am. Pt states was seen at ER at time of fall and they found the fetal heartbeat. Pt states she did not know if it was from walking on crutches and the pressure. Pt given an appt today for evaluation with Dr.Eure.

## 2015-12-04 ENCOUNTER — Telehealth: Payer: Self-pay | Admitting: *Deleted

## 2015-12-04 NOTE — Telephone Encounter (Signed)
left ankle fracture, DOI 11-18-15  Patient complains that ankle is still very swollen, states she is still icing and elevating but ankle swelling has only went down a little

## 2015-12-08 ENCOUNTER — Ambulatory Visit (INDEPENDENT_AMBULATORY_CARE_PROVIDER_SITE_OTHER): Payer: PRIVATE HEALTH INSURANCE | Admitting: Obstetrics & Gynecology

## 2015-12-08 ENCOUNTER — Encounter: Payer: PRIVATE HEALTH INSURANCE | Admitting: Obstetrics & Gynecology

## 2015-12-08 ENCOUNTER — Encounter: Payer: Self-pay | Admitting: Obstetrics & Gynecology

## 2015-12-08 ENCOUNTER — Ambulatory Visit: Payer: PRIVATE HEALTH INSURANCE

## 2015-12-08 VITALS — BP 106/70 | HR 78 | Wt 226.0 lb

## 2015-12-08 DIAGNOSIS — Z1389 Encounter for screening for other disorder: Secondary | ICD-10-CM

## 2015-12-08 DIAGNOSIS — Z3493 Encounter for supervision of normal pregnancy, unspecified, third trimester: Secondary | ICD-10-CM

## 2015-12-08 DIAGNOSIS — Z131 Encounter for screening for diabetes mellitus: Secondary | ICD-10-CM

## 2015-12-08 DIAGNOSIS — Z369 Encounter for antenatal screening, unspecified: Secondary | ICD-10-CM

## 2015-12-08 DIAGNOSIS — Z331 Pregnant state, incidental: Secondary | ICD-10-CM

## 2015-12-08 LAB — POCT URINALYSIS DIPSTICK
GLUCOSE UA: NEGATIVE
KETONES UA: NEGATIVE
Leukocytes, UA: NEGATIVE
NITRITE UA: NEGATIVE
Protein, UA: NEGATIVE
RBC UA: NEGATIVE

## 2015-12-08 NOTE — Progress Notes (Signed)
Fetal ECHO normal(look under Care everywhere tab: [facility])  G2P1001 [redacted]w[redacted]d Estimated Date of Delivery: 02/26/16  Blood pressure 106/70, pulse 78, weight 226 lb (102.513 kg), last menstrual period 05/22/2015.   BP weight and urine results all reviewed and noted.  Please refer to the obstetrical flow sheet for the fundal height and fetal heart rate documentation:  Patient reports good fetal movement, denies any bleeding and no rupture of membranes symptoms or regular contractions. Patient is without complaints. All questions were answered.  Orders Placed This Encounter  Procedures  . POCT urinalysis dipstick    Plan:  Continued routine obstetrical care, PN2, left leg is stable  Return in about 3 weeks (around 12/29/2015) for LROB.

## 2015-12-09 LAB — CBC
HEMOGLOBIN: 12.3 g/dL (ref 11.1–15.9)
Hematocrit: 35.2 % (ref 34.0–46.6)
MCH: 30.8 pg (ref 26.6–33.0)
MCHC: 34.9 g/dL (ref 31.5–35.7)
MCV: 88 fL (ref 79–97)
Platelets: 295 10*3/uL (ref 150–379)
RBC: 3.99 x10E6/uL (ref 3.77–5.28)
RDW: 13.9 % (ref 12.3–15.4)
WBC: 9.9 10*3/uL (ref 3.4–10.8)

## 2015-12-09 LAB — HIV ANTIBODY (ROUTINE TESTING W REFLEX): HIV Screen 4th Generation wRfx: NONREACTIVE

## 2015-12-09 LAB — RPR: RPR: NONREACTIVE

## 2015-12-09 LAB — GLUCOSE TOLERANCE, 2 HOURS W/ 1HR
GLUCOSE, 1 HOUR: 147 mg/dL (ref 65–179)
GLUCOSE, 2 HOUR: 128 mg/dL (ref 65–152)
GLUCOSE, FASTING: 87 mg/dL (ref 65–91)

## 2015-12-09 LAB — ANTIBODY SCREEN: Antibody Screen: NEGATIVE

## 2015-12-11 ENCOUNTER — Telehealth: Payer: Self-pay | Admitting: Orthopedic Surgery

## 2015-12-11 NOTE — Telephone Encounter (Signed)
Patient called to inquire about ankle swelling, and some discoloration, like bruising, which she states is not much different since she had last contacted [place]; her date of injury was 11/18/15; next scheduled appointment is this Friday, 12/15/15. Please advise.  UV#[phone].

## 2015-12-12 NOTE — Telephone Encounter (Signed)
Routing to Dr Harrison for review 

## 2015-12-13 ENCOUNTER — Ambulatory Visit: Payer: No Typology Code available for payment source | Admitting: Orthopedic Surgery

## 2015-12-13 NOTE — Telephone Encounter (Signed)
Please advise per Dr Romeo Apple, he will address at time of office visit.

## 2015-12-13 NOTE — Telephone Encounter (Signed)
fri appropriate

## 2015-12-13 NOTE — Telephone Encounter (Signed)
I called and made patient aware.  

## 2015-12-15 ENCOUNTER — Ambulatory Visit (INDEPENDENT_AMBULATORY_CARE_PROVIDER_SITE_OTHER): Payer: Self-pay | Admitting: Orthopedic Surgery

## 2015-12-15 ENCOUNTER — Encounter: Payer: Self-pay | Admitting: Orthopedic Surgery

## 2015-12-15 ENCOUNTER — Ambulatory Visit (INDEPENDENT_AMBULATORY_CARE_PROVIDER_SITE_OTHER): Payer: No Typology Code available for payment source

## 2015-12-15 ENCOUNTER — Ambulatory Visit: Payer: No Typology Code available for payment source | Admitting: Orthopedic Surgery

## 2015-12-15 VITALS — BP 118/83 | HR 111 | Temp 97.9°F | Resp 16 | Ht 67.0 in | Wt 225.0 lb

## 2015-12-15 DIAGNOSIS — S82892A Other fracture of left lower leg, initial encounter for closed fracture: Secondary | ICD-10-CM

## 2015-12-15 NOTE — Progress Notes (Signed)
FRACTURE CARE   Patient ID: [name], female   DOB: [birth year], 35 y.o.   MRN: [id number]  Chief Complaint  Patient presents with  . Ankle Pain    I am here for a follow up for my fractured fibyula on the left.   inj date 11-18-15 DX fracture lateral malleolus   TREATMENT walking boot   PAIN MEDS:  Current outpatient prescriptions:  .  acetaminophen (TYLENOL) 500 MG tablet, Take 500 mg by mouth every 6 (six) hours as needed., Disp: , Rfl:  .  prenatal vitamin w/FE, FA (PRENATAL 1 + 1) 27-1 MG TABS tablet, Take 1 tablet by mouth daily at 12 noon., Disp: 30 each, Rfl: 11 .  fluticasone (FLONASE) 50 MCG/ACT nasal spray, Place 2 sprays into both nostrils as needed for allergies or rhinitis. Reported on 12/15/2015, Disp: , Rfl:  .  oxyCODONE-acetaminophen (PERCOCET/ROXICET) 5-325 MG tablet, Take 1 tablet by mouth every 8 (eight) hours as needed for severe pain. (Patient not taking: Reported on 12/08/2015), Disp: 30 tablet, Rfl: 0  WEIGHT BEARING STATUS: AT  [facility] today's x-ray was ordered and read by me as 2 views left ankle to decrease radiation exposure. Appropriate blocking devices placed   The fibular fracture shows BP 118/83 mmHg  Pulse 111  Temp(Src) 97.9 F (36.6 C)  Resp 16  Ht  (1.702 m)  Wt 225 lb (102.059 kg)  BMI 35.23 kg/m2  LMP 05/22/2015 (Exact Date)  Mild swelling with plantigrade left foot. Still tender at fracture site. Neurovascular exam intact.    ASSESSMENT AND PLAN   Stable continue walking boot  xrays in 4 weeks left ankle

## 2015-12-19 ENCOUNTER — Encounter: Payer: Self-pay | Admitting: Orthopedic Surgery

## 2015-12-19 ENCOUNTER — Telehealth: Payer: Self-pay | Admitting: Orthopedic Surgery

## 2015-12-19 NOTE — Telephone Encounter (Signed)
Approved for return to work restricted duty as in this message

## 2015-12-19 NOTE — Telephone Encounter (Signed)
Notified patient; work note issued accordingly.

## 2015-12-19 NOTE — Telephone Encounter (Signed)
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  Patient is treating for ankle fracture, date of injury 11/18/15, and is also pregnant.  Please advise.  Ph# [phone]

## 2015-12-29 ENCOUNTER — Ambulatory Visit (INDEPENDENT_AMBULATORY_CARE_PROVIDER_SITE_OTHER): Payer: PRIVATE HEALTH INSURANCE | Admitting: Obstetrics & Gynecology

## 2015-12-29 ENCOUNTER — Encounter: Payer: Self-pay | Admitting: Obstetrics & Gynecology

## 2015-12-29 VITALS — BP 130/80 | HR 72 | Wt 230.0 lb

## 2015-12-29 DIAGNOSIS — Z1389 Encounter for screening for other disorder: Secondary | ICD-10-CM

## 2015-12-29 DIAGNOSIS — Z3A32 32 weeks gestation of pregnancy: Secondary | ICD-10-CM

## 2015-12-29 DIAGNOSIS — Z3493 Encounter for supervision of normal pregnancy, unspecified, third trimester: Secondary | ICD-10-CM

## 2015-12-29 DIAGNOSIS — Z3483 Encounter for supervision of other normal pregnancy, third trimester: Secondary | ICD-10-CM

## 2015-12-29 DIAGNOSIS — Z331 Pregnant state, incidental: Secondary | ICD-10-CM

## 2015-12-29 LAB — POCT URINALYSIS DIPSTICK
Blood, UA: NEGATIVE
GLUCOSE UA: NEGATIVE
Ketones, UA: NEGATIVE
LEUKOCYTES UA: NEGATIVE
NITRITE UA: NEGATIVE
Protein, UA: NEGATIVE

## 2015-12-29 NOTE — Progress Notes (Signed)
G2P1001 [redacted]w[redacted]d Estimated Date of Delivery: 02/26/16  Blood pressure 130/80, pulse 72, weight 230 lb (104.327 kg), last menstrual period 05/22/2015.   BP weight and urine results all reviewed and noted.  Please refer to the obstetrical flow sheet for the fundal height and fetal heart rate documentation:  Patient reports good fetal movement, denies any bleeding and no rupture of membranes symptoms or regular contractions. Patient is without complaints. All questions were answered.  Orders Placed This Encounter  Procedures  . POCT urinalysis dipstick    Plan:  Continued routine obstetrical care,   Return in about 2 weeks (around 01/12/2016) for LROB.  VBAC consent signed

## 2016-01-12 ENCOUNTER — Ambulatory Visit (INDEPENDENT_AMBULATORY_CARE_PROVIDER_SITE_OTHER): Payer: PRIVATE HEALTH INSURANCE | Admitting: Obstetrics & Gynecology

## 2016-01-12 VITALS — BP 122/84 | HR 110 | Wt 233.5 lb

## 2016-01-12 DIAGNOSIS — Z3493 Encounter for supervision of normal pregnancy, unspecified, third trimester: Secondary | ICD-10-CM

## 2016-01-12 DIAGNOSIS — Z1389 Encounter for screening for other disorder: Secondary | ICD-10-CM

## 2016-01-12 DIAGNOSIS — Z331 Pregnant state, incidental: Secondary | ICD-10-CM

## 2016-01-12 LAB — POCT URINALYSIS DIPSTICK
Blood, UA: NEGATIVE
GLUCOSE UA: NEGATIVE
KETONES UA: NEGATIVE
LEUKOCYTES UA: NEGATIVE
NITRITE UA: NEGATIVE

## 2016-01-12 NOTE — Progress Notes (Signed)
G2P1001 1864w4d Estimated Date of Delivery: 02/26/16  Blood pressure 122/84, pulse 110, weight 233 lb 8 oz (105.915 kg), last menstrual period 05/22/2015.   BP weight and urine results all reviewed and noted.  Please refer to the obstetrical flow sheet for the fundal height and fetal heart rate documentation:  Patient reports good fetal movement, denies any bleeding and no rupture of membranes symptoms or regular contractions. Patient is without complaints. All questions were answered.  Orders Placed This Encounter  Procedures  . POCT urinalysis dipstick    Plan:  Continued routine obstetrical care, hopefully getting rid of the boot next Monday  Return in about 2 weeks (around 01/26/2016) for LROB.

## 2016-01-15 ENCOUNTER — Encounter: Payer: Self-pay | Admitting: Orthopedic Surgery

## 2016-01-15 ENCOUNTER — Ambulatory Visit: Payer: No Typology Code available for payment source | Admitting: Orthopedic Surgery

## 2016-01-15 ENCOUNTER — Ambulatory Visit: Payer: No Typology Code available for payment source

## 2016-01-15 ENCOUNTER — Ambulatory Visit (INDEPENDENT_AMBULATORY_CARE_PROVIDER_SITE_OTHER): Payer: No Typology Code available for payment source

## 2016-01-15 VITALS — BP 118/75 | Ht 67.0 in | Wt 233.0 lb

## 2016-01-15 DIAGNOSIS — S8262XD Displaced fracture of lateral malleolus of left fibula, subsequent encounter for closed fracture with routine healing: Secondary | ICD-10-CM

## 2016-01-15 DIAGNOSIS — S82892A Other fracture of left lower leg, initial encounter for closed fracture: Secondary | ICD-10-CM

## 2016-01-15 NOTE — Patient Instructions (Signed)
Continue work restrictions 

## 2016-01-15 NOTE — Progress Notes (Signed)
FRACTURE CARE   Patient ID: [name], female   DOB: 02-Nov-1981, 35 y.o.   MRN: [id number]  Chief Complaint  Patient presents with  . Follow-up    4 week follow up + xray left ankle, DOI 11/18/15    DX Weber B lateral malleolus fracture  TREATMENT Cam Walker walking boot  PAIN MEDS:  Current Outpatient Prescriptions  Medication Sig Dispense Refill  . acetaminophen (TYLENOL) 500 MG tablet Take 500 mg by mouth every 6 (six) hours as needed.    . prenatal vitamin w/FE, FA (PRENATAL 1 + 1) 27-1 MG TABS tablet Take 1 tablet by mouth daily at 12 noon. 30 each 11   No current facility-administered medications for this visit.     WEIGHT BEARING STATUS as tolerated  XRAYS healed fibular fracture  EXAM there is still some tenderness at the fracture site.  BP 118/75 mmHg  Ht 5\' 7"  (1.702 m)  Wt 233 lb (105.688 kg)  BMI 36.48 kg/m2  LMP 05/22/2015 (Exact Date)      ASSESSMENT AND PLAN   Come back 3 weeks x-ray continue same work restrictions continue [employer]  Next visit x-ray and then clinical exam will determine if she needs an Aircast or an ASO brace.

## 2016-01-26 ENCOUNTER — Encounter: Payer: Self-pay | Admitting: Obstetrics and Gynecology

## 2016-01-26 ENCOUNTER — Ambulatory Visit (INDEPENDENT_AMBULATORY_CARE_PROVIDER_SITE_OTHER): Payer: PRIVATE HEALTH INSURANCE | Admitting: Obstetrics and Gynecology

## 2016-01-26 VITALS — BP 110/70 | HR 108

## 2016-01-26 DIAGNOSIS — Z1389 Encounter for screening for other disorder: Secondary | ICD-10-CM

## 2016-01-26 DIAGNOSIS — Z3493 Encounter for supervision of normal pregnancy, unspecified, third trimester: Secondary | ICD-10-CM

## 2016-01-26 DIAGNOSIS — O34219 Maternal care for unspecified type scar from previous cesarean delivery: Secondary | ICD-10-CM

## 2016-01-26 DIAGNOSIS — Z331 Pregnant state, incidental: Secondary | ICD-10-CM

## 2016-01-26 LAB — POCT URINALYSIS DIPSTICK
GLUCOSE UA: NEGATIVE
Ketones, UA: NEGATIVE
Leukocytes, UA: NEGATIVE
NITRITE UA: NEGATIVE
RBC UA: NEGATIVE

## 2016-01-26 NOTE — Progress Notes (Signed)
G2P1001 718w4d Estimated Date of Delivery: 02/26/16  Blood pressure 110/70, pulse 108, last menstrual period 05/22/2015.   BP weight and urine results all reviewed and noted.  Please refer to the obstetrical flow sheet for the fundal height and fetal heart rate documentation:  Patient reports good fetal movement, denies any bleeding and no rupture of membranes symptoms or regular contractions. Pt states that she has been experiencing intermittent bilateral lower abdominal pain due to position change. Pt has had swelling to bilateral LE. Pt denies vaginal discharge/bleeding and any other symptoms.   Fundal height: 39 cm Fetal heart rate: 139  All questions were answered.  Orders Placed This Encounter  Procedures  . POCT urinalysis dipstick    Plan:  Continued routine obstetrical care, return in 2 weeks for routine OB visit, group B strep test  No Follow-up on file.    By signing my name below, I, Soijett Blue, attest that this documentation has been prepared under the direction and in the presence of Tilda BurrowJohn Gabrien Mentink V, MD. Electronically Signed: Soijett Blue, ED Scribe. 01/26/2016. 11:43 AM.  I personally performed the services described in this documentation, which was SCRIBED in my presence. The recorded information has been reviewed and considered accurate. It has been edited as necessary during review. Tilda BurrowFERGUSON,Socorro Ebron V, MD

## 2016-01-26 NOTE — Progress Notes (Signed)
Pt states that she has noticed some pain in her lower abdomen, maybe round ligament.

## 2016-02-05 ENCOUNTER — Ambulatory Visit (INDEPENDENT_AMBULATORY_CARE_PROVIDER_SITE_OTHER): Payer: No Typology Code available for payment source

## 2016-02-05 ENCOUNTER — Ambulatory Visit: Payer: No Typology Code available for payment source | Admitting: Orthopedic Surgery

## 2016-02-05 ENCOUNTER — Encounter: Payer: Self-pay | Admitting: Orthopedic Surgery

## 2016-02-05 VITALS — BP 116/75 | Ht 67.0 in | Wt 233.0 lb

## 2016-02-05 DIAGNOSIS — S82892A Other fracture of left lower leg, initial encounter for closed fracture: Secondary | ICD-10-CM

## 2016-02-05 NOTE — Progress Notes (Signed)
Chief Complaint  Patient presents with  . Follow-up    FOLLOW UP + XRAY LEFT ANKLE FX, DOI 11/18/15    Left ankle fracture  Approximately 10-12 weeks post injury  Cam Walker brace for treatment  X-rays show fracture stability ankle stability and fracture healing  Patient released can remove boot follow-up as needed

## 2016-02-05 NOTE — Patient Instructions (Signed)
REMOVE BRACE ACTIVITY AS TOLERATED

## 2016-02-09 ENCOUNTER — Encounter: Payer: Self-pay | Admitting: Obstetrics & Gynecology

## 2016-02-09 ENCOUNTER — Ambulatory Visit (INDEPENDENT_AMBULATORY_CARE_PROVIDER_SITE_OTHER): Payer: PRIVATE HEALTH INSURANCE | Admitting: Obstetrics & Gynecology

## 2016-02-09 VITALS — BP 120/80 | HR 72 | Wt 240.0 lb

## 2016-02-09 DIAGNOSIS — Z1159 Encounter for screening for other viral diseases: Secondary | ICD-10-CM

## 2016-02-09 DIAGNOSIS — Z1389 Encounter for screening for other disorder: Secondary | ICD-10-CM

## 2016-02-09 DIAGNOSIS — Z331 Pregnant state, incidental: Secondary | ICD-10-CM

## 2016-02-09 DIAGNOSIS — Z118 Encounter for screening for other infectious and parasitic diseases: Secondary | ICD-10-CM

## 2016-02-09 DIAGNOSIS — Z3685 Encounter for antenatal screening for Streptococcus B: Secondary | ICD-10-CM

## 2016-02-09 DIAGNOSIS — Z3493 Encounter for supervision of normal pregnancy, unspecified, third trimester: Secondary | ICD-10-CM

## 2016-02-09 LAB — POCT URINALYSIS DIPSTICK
Glucose, UA: NEGATIVE
KETONES UA: NEGATIVE
Nitrite, UA: NEGATIVE
RBC UA: NEGATIVE

## 2016-02-09 NOTE — Progress Notes (Signed)
G2P1001 6829w4d Estimated Date of Delivery: 02/26/16  Blood pressure 120/80, pulse 72, weight 240 lb (108.863 kg), last menstrual period 05/22/2015.   BP weight and urine results all reviewed and noted.  Please refer to the obstetrical flow sheet for the fundal height and fetal heart rate documentation:  Patient reports good fetal movement, denies any bleeding and no rupture of membranes symptoms or regular contractions. Patient is without complaints. All questions were answered.  Orders Placed This Encounter  Procedures  . GC/Chlamydia Probe Amp  . Strep Gp B NAA  . POCT urinalysis dipstick    Plan:  Continued routine obstetrical care, GBS done  No Follow-up on file.

## 2016-02-11 LAB — STREP GP B NAA: Strep Gp B NAA: NEGATIVE

## 2016-02-13 LAB — GC/CHLAMYDIA PROBE AMP
Chlamydia trachomatis, NAA: NEGATIVE
Neisseria gonorrhoeae by PCR: NEGATIVE

## 2016-02-16 ENCOUNTER — Encounter: Payer: Self-pay | Admitting: Obstetrics & Gynecology

## 2016-02-16 ENCOUNTER — Ambulatory Visit (INDEPENDENT_AMBULATORY_CARE_PROVIDER_SITE_OTHER): Payer: PRIVATE HEALTH INSURANCE | Admitting: Obstetrics & Gynecology

## 2016-02-16 VITALS — BP 110/82 | HR 94 | Wt 240.5 lb

## 2016-02-16 DIAGNOSIS — Z3A39 39 weeks gestation of pregnancy: Secondary | ICD-10-CM | POA: Diagnosis not present

## 2016-02-16 DIAGNOSIS — Z3493 Encounter for supervision of normal pregnancy, unspecified, third trimester: Secondary | ICD-10-CM

## 2016-02-16 DIAGNOSIS — O34219 Maternal care for unspecified type scar from previous cesarean delivery: Secondary | ICD-10-CM

## 2016-02-16 DIAGNOSIS — Z331 Pregnant state, incidental: Secondary | ICD-10-CM

## 2016-02-16 DIAGNOSIS — Z3483 Encounter for supervision of other normal pregnancy, third trimester: Secondary | ICD-10-CM

## 2016-02-16 DIAGNOSIS — Z1389 Encounter for screening for other disorder: Secondary | ICD-10-CM

## 2016-02-16 LAB — POCT URINALYSIS DIPSTICK
GLUCOSE UA: NEGATIVE
Ketones, UA: NEGATIVE
Leukocytes, UA: NEGATIVE
NITRITE UA: NEGATIVE
Protein, UA: NEGATIVE
RBC UA: NEGATIVE

## 2016-02-16 NOTE — Progress Notes (Signed)
G2P1001 1642w4d Estimated Date of Delivery: 02/26/16  Blood pressure 110/82, pulse 94, weight 240 lb 8 oz (109.09 kg), last menstrual period 05/22/2015.   BP weight and urine results all reviewed and noted.  Please refer to the obstetrical flow sheet for the fundal height and fetal heart rate documentation:  Patient reports good fetal movement, denies any bleeding and no rupture of membranes symptoms or regular contractions. Patient is without complaints. All questions were answered.  Orders Placed This Encounter  Procedures  . POCT Urinalysis Dipstick    Plan:  Continued routine obstetrical care, cervix posterior  No Follow-up on file.

## 2016-02-21 ENCOUNTER — Inpatient Hospital Stay (HOSPITAL_COMMUNITY): Payer: No Typology Code available for payment source | Admitting: Anesthesiology

## 2016-02-21 ENCOUNTER — Inpatient Hospital Stay (HOSPITAL_COMMUNITY)
Admission: AD | Admit: 2016-02-21 | Discharge: 2016-02-23 | DRG: 775 | Disposition: A | Discharge status: Home / Self Care | Payer: No Typology Code available for payment source | Source: Ambulatory Visit | Attending: Obstetrics and Gynecology | Admitting: Obstetrics and Gynecology

## 2016-02-21 ENCOUNTER — Encounter (HOSPITAL_COMMUNITY): Payer: Self-pay | Admitting: *Deleted

## 2016-02-21 DIAGNOSIS — O34219 Maternal care for unspecified type scar from previous cesarean delivery: Secondary | ICD-10-CM | POA: Diagnosis present

## 2016-02-21 DIAGNOSIS — Z8249 Family history of ischemic heart disease and other diseases of the circulatory system: Secondary | ICD-10-CM

## 2016-02-21 DIAGNOSIS — Z3A39 39 weeks gestation of pregnancy: Secondary | ICD-10-CM | POA: Diagnosis not present

## 2016-02-21 DIAGNOSIS — O34211 Maternal care for low transverse scar from previous cesarean delivery: Secondary | ICD-10-CM | POA: Diagnosis present

## 2016-02-21 DIAGNOSIS — Z823 Family history of stroke: Secondary | ICD-10-CM | POA: Diagnosis not present

## 2016-02-21 DIAGNOSIS — Q210 Ventricular septal defect: Secondary | ICD-10-CM | POA: Diagnosis not present

## 2016-02-21 DIAGNOSIS — O4202 Full-term premature rupture of membranes, onset of labor within 24 hours of rupture: Principal | ICD-10-CM | POA: Diagnosis present

## 2016-02-21 DIAGNOSIS — Z3490 Encounter for supervision of normal pregnancy, unspecified, unspecified trimester: Secondary | ICD-10-CM

## 2016-02-21 LAB — TYPE AND SCREEN
ABO/RH(D): O POS
ANTIBODY SCREEN: NEGATIVE

## 2016-02-21 LAB — CBC
HCT: 37.1 % (ref 36.0–46.0)
Hemoglobin: 13.2 g/dL (ref 12.0–15.0)
MCH: 31.1 pg (ref 26.0–34.0)
MCHC: 35.6 g/dL (ref 30.0–36.0)
MCV: 87.3 fL (ref 78.0–100.0)
PLATELETS: 268 10*3/uL (ref 150–400)
RBC: 4.25 MIL/uL (ref 3.87–5.11)
RDW: 14.6 % (ref 11.5–15.5)
WBC: 12.8 10*3/uL — AB (ref 4.0–10.5)

## 2016-02-21 LAB — RPR: RPR Ser Ql: NONREACTIVE

## 2016-02-21 LAB — ABO/RH: ABO/RH(D): O POS

## 2016-02-21 MED ORDER — LACTATED RINGERS IV SOLN: 500.0000 mL | INTRAVENOUS | Status: DC | PRN

## 2016-02-21 MED ORDER — FENTANYL CITRATE (PF) 100 MCG/2ML IJ SOLN
100.0000 ug | INTRAMUSCULAR | Status: DC | PRN
  Administered 2016-02-21 (×3): 100 ug via INTRAVENOUS
  Filled 2016-02-21 (×3): qty 2

## 2016-02-21 MED ORDER — LACTATED RINGERS IV SOLN: 500.0000 mL | Freq: Once | INTRAVENOUS | Status: DC

## 2016-02-21 MED ORDER — PHENYLEPHRINE 40 MCG/ML (10ML) SYRINGE FOR IV PUSH (FOR BLOOD PRESSURE SUPPORT)
80.0000 ug | PREFILLED_SYRINGE | INTRAVENOUS | Status: DC | PRN
  Filled 2016-02-21: qty 2

## 2016-02-21 MED ORDER — CITRIC ACID-SODIUM CITRATE 334-500 MG/5ML PO SOLN: 30.0000 mL | ORAL | Status: DC | PRN

## 2016-02-21 MED ORDER — FENTANYL 2.5 MCG/ML BUPIVACAINE 1/10 % EPIDURAL INFUSION (WH - ANES)
14.0000 mL/h | INTRAMUSCULAR | Status: DC | PRN
  Administered 2016-02-21: 14 mL/h via EPIDURAL
  Filled 2016-02-21: qty 125

## 2016-02-21 MED ORDER — ONDANSETRON HCL 4 MG/2ML IJ SOLN
4.0000 mg | Freq: Four times a day (QID) | INTRAMUSCULAR | Status: DC | PRN
  Administered 2016-02-22: 4 mg via INTRAVENOUS
  Filled 2016-02-21: qty 2

## 2016-02-21 MED ORDER — OXYCODONE-ACETAMINOPHEN 5-325 MG PO TABS: 2.0000 | ORAL_TABLET | ORAL | Status: DC | PRN

## 2016-02-21 MED ORDER — OXYTOCIN BOLUS FROM INFUSION
500.0000 mL | INTRAVENOUS | Status: DC
  Administered 2016-02-22: 500 mL via INTRAVENOUS

## 2016-02-21 MED ORDER — DIPHENHYDRAMINE HCL 50 MG/ML IJ SOLN: 12.5000 mg | INTRAMUSCULAR | Status: DC | PRN

## 2016-02-21 MED ORDER — ACETAMINOPHEN 325 MG PO TABS
650.0000 mg | ORAL_TABLET | ORAL | Status: DC | PRN
  Administered 2016-02-22: 650 mg via ORAL
  Filled 2016-02-21: qty 2

## 2016-02-21 MED ORDER — LIDOCAINE HCL (PF) 1 % IJ SOLN
30.0000 mL | INTRAMUSCULAR | Status: DC | PRN
  Administered 2016-02-22: 30 mL via SUBCUTANEOUS
  Filled 2016-02-21: qty 30

## 2016-02-21 MED ORDER — TERBUTALINE SULFATE 1 MG/ML IJ SOLN
0.2500 mg | Freq: Once | INTRAMUSCULAR | Status: DC | PRN
  Filled 2016-02-21: qty 1

## 2016-02-21 MED ORDER — LIDOCAINE HCL (PF) 1 % IJ SOLN
INTRAMUSCULAR | Status: DC | PRN
  Administered 2016-02-21 (×2): 8 mL via EPIDURAL

## 2016-02-21 MED ORDER — EPHEDRINE 5 MG/ML INJ
10.0000 mg | INTRAVENOUS | Status: DC | PRN
  Filled 2016-02-21: qty 2

## 2016-02-21 MED ORDER — OXYTOCIN 10 UNIT/ML IJ SOLN
1.0000 m[IU]/min | INTRAVENOUS | Status: DC
  Administered 2016-02-21: 2 m[IU]/min via INTRAVENOUS
  Filled 2016-02-21: qty 4

## 2016-02-21 MED ORDER — LACTATED RINGERS IV SOLN
INTRAVENOUS | Status: DC
  Administered 2016-02-21 (×3): via INTRAVENOUS

## 2016-02-21 MED ORDER — OXYTOCIN 10 UNIT/ML IJ SOLN: 2.5000 [IU]/h | INTRAVENOUS | Status: DC

## 2016-02-21 MED ORDER — OXYCODONE-ACETAMINOPHEN 5-325 MG PO TABS: 1.0000 | ORAL_TABLET | ORAL | Status: DC | PRN

## 2016-02-21 MED ORDER — PHENYLEPHRINE 40 MCG/ML (10ML) SYRINGE FOR IV PUSH (FOR BLOOD PRESSURE SUPPORT)
80.0000 ug | PREFILLED_SYRINGE | INTRAVENOUS | Status: DC | PRN
  Filled 2016-02-21: qty 20
  Filled 2016-02-21: qty 2

## 2016-02-21 NOTE — Progress Notes (Signed)
Dr Alvester MorinNewton notified of pt's complaints, ROM VE, orders received

## 2016-02-21 NOTE — MAU Note (Signed)
Pt presents to MAU with complaints of rupture of membranes at 530 this morning. Pt had a previous C/S in June 2012 for breech presentation. Has signed VBAC consent in the office

## 2016-02-21 NOTE — H&P (Signed)
OBSTETRIC ADMISSION HISTORY AND PHYSICAL  [name] is a 35 y.o. female G2P1001 with IUP at 8880w2d presenting for induction of labor after her membranes ruptured at 5:30am today. Patient states the fluid was clear, without blood; she denies any vaginal bleeding or discharge since. She notes pelvic pain and the onset of contractions, though reports that they are weak and infrequent. She reports +FMs, No LOF, no VB, no blurry vision, headaches or peripheral edema, and RUQ pain.  She requests an epidural for pain control.  She plans on breast feeding. She request OCPs for birth control.   Dating: By Duane LopeLuther Eure --->  Estimated Date of Delivery: 02/26/16  Sono:    @[redacted]w[redacted]d , CWD, normal anatomy, breech presentation, vertical lie, 921g, 53% EFW   Prenatal History/Complications:  Past Medical History: Past Medical History  Diagnosis Date  . 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 Pregnant 07/11/2015    Past Surgical History: Past Surgical History  Procedure Laterality Date  . Wisdom tooth extraction      2005  . Cesarean section      Obstetrical History: OB History    Gravida Para Term Preterm AB TAB SAB Ectopic Multiple Living   2 1 1       1       Social History: Social History   Social History  . Marital Status: Married    Spouse Name: N/A  . Number of Children: N/A  . Years of Education: N/A   Social History Main Topics  . Smoking status: Never Smoker   . Smokeless tobacco: Never Used  . Alcohol Use: No  . Drug Use: No  . Sexual Activity: Yes    Birth Control/ Protection: None   Other Topics Concern  . None   Social History Narrative    Family History: Family History  Problem Relation Age of Onset  . Hypertension Mother   . Hypertension Father   . Macular degeneration Father   . Stroke Paternal Grandmother   . Stroke Maternal Grandmother   . Cancer Maternal Grandfather     lung  .  ALS Paternal Grandfather   . Cancer Other     colon-maternal great grandma    Allergies: No Known Allergies  Prescriptions prior to admission  Medication Sig Dispense Refill Last Dose  . acetaminophen (TYLENOL) 500 MG tablet Take 500 mg by mouth every 6 (six) hours as needed for moderate pain.    Past Week at Unknown time  . prenatal vitamin w/FE, FA (PRENATAL 1 + 1) 27-1 MG TABS tablet Take 1 tablet by mouth daily at 12 noon. 30 each 11 02/20/2016 at Unknown time     Review of Systems   All systems reviewed and negative except as stated in HPI  Blood pressure 141/85, pulse 102, temperature 97.7 F (36.5 C), resp. rate 18, last menstrual period 05/22/2015. General appearance: Well-appearing, in no acute distress. Lungs: clear to auscultation bilaterally Heart: regular rate and rhythm, 3/6 holosystolic murmur best appreciated at left sternal border Abdomen: soft, non-tender; bowel sounds normal Pelvic: not examined Extremities: Homans sign is negative, no sign of DVT DTR's not examined Presentation: longitudinal cephalic Fetal monitoring HR 146bpm, fetus is active Uterine activityFrequency: none  Dilation: Closed Effacement (%): Thick Station: -2 Exam by:: Ginger Morris rn   Prenatal labs: ABO, Rh: --/--/O POS (04/12 0850) Antibody: NEG (04/12 0850) Rubella: !Error! RPR: Non Reactive (01/27 0919)  HBsAg: Negative (09/23 1057)  HIV: Non Reactive (01/27 0919)  GBS: Negative (03/31 1402)  1 hr Glucola 147  Genetic screening  Not performed Anatomy US normal  Prenatal Transfer Tool  Maternal Diabetes: passed OGTT Genetic Screening: 09/15/15, Integrated #2, negative Maternal Ultrasounds/Referrals: Normal Fetal Ultrasounds or other Referrals:  None Maternal Substance Abuse:  n/a Significant Maternal Medications:  n/a Significant Maternal Lab Results: GBS neg  Results for orders placed or performed during the hospital encounter of 02/21/16 (from the past 24 hour(s))   CBC   Collection Time: 02/21/16  8:50 AM  Result Value Ref Range   WBC 12.8 (H) 4.0 - 10.5 K/uL   RBC 4.25 3.87 - 5.11 MIL/uL   Hemoglobin 13.2 12.0 - 15.0 g/dL   HCT 16.1 09.6 - 04.5 %   MCV 87.3 78.0 - 100.0 fL   MCH 31.1 26.0 - 34.0 pg   MCHC 35.6 30.0 - 36.0 g/dL   RDW 40.9 81.1 - 91.4 %   Platelets 268 150 - 400 K/uL  Type and screen [facility]    Collection Time: 02/21/16  8:50 AM  Result Value Ref Range   ABO/RH(D) O POS    Antibody Screen NEG    Sample Expiration 02/24/2016     Patient Active Problem List   Diagnosis Date Noted  . Labor and delivery indication for care or intervention 02/21/2016  . Supervision of normal pregnancy 08/04/2015  . H/O VSD (ventricular septal defect) 08/04/2015  . Previous cesarean section complicating pregnancy 08/04/2015    Assessment: [name] is a 35 y.o. G2P1001 at [redacted]w[redacted]d here for induction of labor after her rupture of membranes at 5:30am today. Given her prior c-section, she is counseled on options regarding the induction of labor (balloon inflation followed by low-dose pitocin, waiting, or opting for a c-section) and the expected time to labor being around 2 days. She is currently considering the options.    #Labor: induction #Pain: epidural #FWB:   #ID: GBS negative #MOF: breastfeeding #MOC: OCPs #Circ: n/a  Malka So, Med Student  02/21/2016, 10:19 AM   I saw and examined the patient with Malka So, MS3 and I agree with the above note.   OB fellow attestation: I have seen and examined this patient; I agree with above documentation in the resident's note.   [name] is a 35 y.o. [phone] here for PROM  PE: BP 104/60 mmHg  Pulse 93  Temp(Src) 97.7 F (36.5 C) (Oral)  Resp 18  Ht  (1.702 m)  Wt 240 lb (108.863 kg)  BMI 37.58 kg/m2  SpO2 100%  LMP 05/22/2015 (Exact Date)  Breastfeeding? Unknown Gen: calm comfortable, NAD Resp: normal effort, no distress Abd:  gravid  ROS, labs, PMH reviewed  Plan: Admit to LD Labor: TOLAC consent signed. Expectant management. In 6-8 hours plan for FB then likely pitocin FWB:Cat I ID: GBS neg  Federico Flake, MD Family Medicine, OB Fellow 02/23/2016, 9:11 AM   Attending physician:  Tinnie Gens MD

## 2016-02-21 NOTE — MAU Note (Addendum)
Water broke around 0530, clear fluid- still coming- pt sitting with 2 towels.  No bleeding. Denies problems with preg. Was closed when checked last. Contracting about every 

## 2016-02-21 NOTE — Anesthesia Procedure Notes (Signed)
Epidural Patient location during procedure: OB Start time: 02/21/2016 8:17 PM End time: 02/21/2016 8:21 PM  Staffing Anesthesiologist: Leilani AbleHATCHETT, Margaree Sandhu Performed by: anesthesiologist   Preanesthetic Checklist Completed: patient identified, surgical consent, pre-op evaluation, timeout performed, IV checked, risks and benefits discussed and monitors and equipment checked  Epidural Patient position: sitting Prep: site prepped and draped and DuraPrep Patient monitoring: continuous pulse ox and blood pressure Approach: midline Location: L3-L4 Injection technique: LOR air  Needle:  Needle type: Tuohy  Needle gauge: 17 G Needle length: 9 cm and 9 Needle insertion depth: 5 cm cm Catheter type: closed end flexible Catheter size: 19 Gauge Catheter at skin depth: 14 cm Test dose: negative and Other  Assessment Sensory level: T9 Events: blood not aspirated, injection not painful, no injection resistance, negative IV test and no paresthesia  Additional Notes Reason for block:procedure for pain

## 2016-02-21 NOTE — Anesthesia Preprocedure Evaluation (Signed)

## 2016-02-21 NOTE — Progress Notes (Signed)
[name] is a [age] y.o. G2P1001 at 1935w2d by LMP admitted for PROM  Subjective:  Reports crampy lower abdominal pain. No contractions. Tolerating this well.   Objective: BP 141/85 mmHg  Pulse 102  Temp(Src) 97.7 F (36.5 C)  Resp 18  LMP 05/22/2015 (Exact Date)     FHT:  FHR: 145 bpm, variability: moderate,  accelerations:  Present,  decelerations:  Absent UC:   irregular SVE:   Dilation: 1.5 Effacement (%): 50 Station: -2 Exam by:: Rafeal Skibicki MD  Placed FB with speculum, inflated with 60 cc LR  Labs: Lab Results  Component Value Date   WBC 12.8* 02/21/2016   HGB 13.2 02/21/2016   HCT 37.1 02/21/2016   MCV 87.3 02/21/2016   PLT 268 02/21/2016    Assessment / Plan: Augmentation of labor, progressing well  Labor: Progressing normally and FB placed  Fetal Wellbeing:  Category I Pain Control:  Labor support without medications, Epidural and IV pain meds I/D:  GBS neg Anticipated MOD:  NSVD, TOLAC  [name] 02/21/2016, 2:19 PM

## 2016-02-22 ENCOUNTER — Encounter: Payer: PRIVATE HEALTH INSURANCE | Admitting: Obstetrics & Gynecology

## 2016-02-22 ENCOUNTER — Encounter (HOSPITAL_COMMUNITY): Payer: Self-pay

## 2016-02-22 DIAGNOSIS — O34211 Maternal care for low transverse scar from previous cesarean delivery: Secondary | ICD-10-CM

## 2016-02-22 DIAGNOSIS — O4202 Full-term premature rupture of membranes, onset of labor within 24 hours of rupture: Secondary | ICD-10-CM

## 2016-02-22 DIAGNOSIS — Q210 Ventricular septal defect: Secondary | ICD-10-CM

## 2016-02-22 DIAGNOSIS — Z3A39 39 weeks gestation of pregnancy: Secondary | ICD-10-CM

## 2016-02-22 MED ORDER — PRENATAL MULTIVITAMIN CH: 1.0000 | ORAL_TABLET | Freq: Every day | ORAL | Status: DC

## 2016-02-22 MED ORDER — DIBUCAINE 1 % RE OINT: 1.0000 "application " | TOPICAL_OINTMENT | RECTAL | Status: DC | PRN

## 2016-02-22 MED ORDER — ACETAMINOPHEN 325 MG PO TABS: 650.0000 mg | ORAL_TABLET | ORAL | Status: DC | PRN

## 2016-02-22 MED ORDER — SENNOSIDES-DOCUSATE SODIUM 8.6-50 MG PO TABS
2.0000 | ORAL_TABLET | ORAL | Status: DC
  Administered 2016-02-23: 2 via ORAL
  Filled 2016-02-22: qty 2

## 2016-02-22 MED ORDER — WITCH HAZEL-GLYCERIN EX PADS: 1.0000 "application " | MEDICATED_PAD | CUTANEOUS | Status: DC | PRN

## 2016-02-22 MED ORDER — BENZOCAINE-MENTHOL 20-0.5 % EX AERO: 1.0000 "application " | INHALATION_SPRAY | CUTANEOUS | Status: DC | PRN

## 2016-02-22 MED ORDER — SENNOSIDES-DOCUSATE SODIUM 8.6-50 MG PO TABS: 2.0000 | ORAL_TABLET | ORAL | Status: DC

## 2016-02-22 MED ORDER — ZOLPIDEM TARTRATE 5 MG PO TABS: 5.0000 mg | ORAL_TABLET | Freq: Every evening | ORAL | Status: DC | PRN

## 2016-02-22 MED ORDER — SIMETHICONE 80 MG PO CHEW: 80.0000 mg | CHEWABLE_TABLET | ORAL | Status: DC | PRN

## 2016-02-22 MED ORDER — IBUPROFEN 600 MG PO TABS: 600.0000 mg | ORAL_TABLET | Freq: Four times a day (QID) | ORAL | Status: DC

## 2016-02-22 MED ORDER — DIPHENHYDRAMINE HCL 25 MG PO CAPS: 25.0000 mg | ORAL_CAPSULE | Freq: Four times a day (QID) | ORAL | Status: DC | PRN

## 2016-02-22 MED ORDER — PRENATAL MULTIVITAMIN CH
1.0000 | ORAL_TABLET | Freq: Every day | ORAL | Status: DC
  Administered 2016-02-22 – 2016-02-23 (×2): 1 via ORAL
  Filled 2016-02-22 (×2): qty 1

## 2016-02-22 MED ORDER — TETANUS-DIPHTH-ACELL PERTUSSIS 5-2.5-18.5 LF-MCG/0.5 IM SUSP: 0.5000 mL | Freq: Once | INTRAMUSCULAR | Status: DC

## 2016-02-22 MED ORDER — ONDANSETRON HCL 4 MG PO TABS: 4.0000 mg | ORAL_TABLET | ORAL | Status: DC | PRN

## 2016-02-22 MED ORDER — ONDANSETRON HCL 4 MG/2ML IJ SOLN: 4.0000 mg | INTRAMUSCULAR | Status: DC | PRN

## 2016-02-22 MED ORDER — COCONUT OIL OIL
1.0000 "application " | TOPICAL_OIL | Status: DC | PRN
  Administered 2016-02-23: 1 via TOPICAL
  Filled 2016-02-22: qty 120

## 2016-02-22 MED ORDER — BENZOCAINE-MENTHOL 20-0.5 % EX AERO
1.0000 "application " | INHALATION_SPRAY | CUTANEOUS | Status: DC | PRN
  Administered 2016-02-22: 1 via TOPICAL
  Filled 2016-02-22: qty 56

## 2016-02-22 MED ORDER — IBUPROFEN 600 MG PO TABS
600.0000 mg | ORAL_TABLET | Freq: Four times a day (QID) | ORAL | Status: DC
  Administered 2016-02-22 – 2016-02-23 (×5): 600 mg via ORAL
  Filled 2016-02-22 (×6): qty 1

## 2016-02-22 MED ORDER — COCONUT OIL OIL: 1.0000 "application " | TOPICAL_OIL | Status: DC | PRN

## 2016-02-22 MED ORDER — MEASLES, MUMPS & RUBELLA VAC ~~LOC~~ INJ
0.5000 mL | INJECTION | Freq: Once | SUBCUTANEOUS | Status: DC
Start: 2016-02-23 — End: 2016-02-23

## 2016-02-22 NOTE — Anesthesia Postprocedure Evaluation (Signed)
Anesthesia Post Note  Patient: [name]  Procedure(s) Performed: * No procedures listed *  Patient location during evaluation: Mother Baby Anesthesia Type: Epidural Level of consciousness: awake and alert and oriented Pain management: satisfactory to patient Vital Signs Assessment: post-procedure vital signs reviewed and stable Respiratory status: spontaneous breathing and nonlabored ventilation Cardiovascular status: stable Postop Assessment: no headache, no backache, no signs of nausea or vomiting, adequate PO intake and patient able to bend at knees (patient up walking) Anesthetic complications: no    Last Vitals:  Filed Vitals:   02/22/16 0629 02/22/16 0801  BP: 130/64   Pulse: 101 100  Temp: 37.1 C   Resp: 17     Last Pain:  Filed Vitals:   02/22/16 0804  PainSc: 0-No pain                 Denzil Mceachron

## 2016-02-22 NOTE — Progress Notes (Signed)
Patient ID: [name], female   DOB: 1981/06/27, 35 y.o.   MRN: [id number] Delivery Note At 2:40 AM a viable and healthy female was delivered via  (Presentation: OA;  ).  APGAR: ,8/8 ; weight  .   Placenta status: ,intact schultz 3vc .  Cord:  with the following complications: .  Cord pH: not done  Anesthesia:  None /epidural Episiotomy:  none Lacerations:  2nd degree Suture Repair: 2.0 vicryl Est. Blood Loss (mL):  250  Mom to postpartum.  Baby to Couplet care / Skin to Skin.  Shiesha Jahn V 02/22/2016, 3:04 AM

## 2016-02-22 NOTE — Progress Notes (Signed)
Post Partum Day 0 Subjective: Patient doing well and resting s/p VBAC at 2:30am.  Objective: Blood pressure 130/64, pulse 101, temperature 98.7 F (37.1 C), temperature source Oral, resp. rate 17, height 5\' 7"  (1.702 m), weight 108.863 kg (240 lb), last menstrual period 05/22/2015, SpO2 100 %, unknown if currently breastfeeding.  Physical Exam:  General: alert and no distress Lochia: appropriate, consistent with amount of menstrual period Uterine Fundus: firm Incision: n/a DVT Evaluation: Negative Homan's sign. No cords or calf tenderness. No significant calf/ankle edema.   Recent Labs  02/21/16 0850  HGB 13.2  HCT 37.1    Assessment/Plan: Plan for discharge tomorrow; mother doing well, has breast fed with baby.   Mother plans to breastfeed baby girl; plans to use OCPs.   LOS: 1 day   Malka Sohomas Alanta Scobey (MS) 02/22/2016, 7:22 AM

## 2016-02-22 NOTE — Lactation Note (Signed)
This note was copied from a baby's chart. Lactation Consultation Note  Patient Name: [name] Reason for consult: Initial assessment Mom reports baby is nursing well but does have some nipple tenderness. She had been using cradle hold but has switched to football hold. RN gave her comfort gels. LC advised to apply EBM. Basic teaching reviewed, encouraged to BF with feeding ques. Reviewed positioning and how to obtain good depth with latch.  Lactation brochure left for review, advised of OP services and support group. Encouraged to call for assist with latch due to nipple tenderness.   Maternal Data Has patient been taught Hand Expression?: Yes Does the patient have breastfeeding experience prior to this delivery?: Yes  Feeding Feeding Type: Breast Fed Length of feed: 30 min  LATCH Score/Interventions Latch: Repeated attempts needed to sustain latch, nipple held in mouth throughout feeding, stimulation needed to elicit sucking reflex. Intervention(s): Adjust position;Assist with latch;Breast compression  Audible Swallowing: A few with stimulation Intervention(s): Skin to skin;Hand expression  Type of Nipple: Everted at rest and after stimulation  Comfort (Breast/Nipple): Filling, red/small blisters or bruises, mild/mod discomfort  Problem noted: Mild/Moderate discomfort Interventions (Mild/moderate discomfort): Hand expression;Comfort gels  Hold (Positioning): No assistance needed to correctly position infant at breast. Intervention(s): Support Pillows;Breastfeeding basics reviewed;Position options;Skin to skin  LATCH Score: 7  Lactation Tools Discussed/Used Tools: Comfort gels WIC Program: No   Consult Status Consult Status: Follow-up Date: 02/23/16 Follow-up type: In-patient    [name], [name] Green, 5:23 PM

## 2016-02-23 MED ORDER — IBUPROFEN 600 MG PO TABS: 600.0000 mg | ORAL_TABLET | Freq: Four times a day (QID) | ORAL | Status: DC

## 2016-02-23 MED ORDER — NORETHINDRONE 0.35 MG PO TABS: 1.0000 | ORAL_TABLET | Freq: Every day | ORAL | Status: DC

## 2016-02-23 NOTE — Discharge Instructions (Signed)
You have elected for oral contraceptive pills for birth control.  [facility]. Nothing per vagina for the next 6 weeks.  No intercourse, no tampons, no douching.    If you have had a c-section:  Your dressing should come off in the next 3-5 days.  If it does not come off on its own, you may manually remove it  If your dressing becomes very saturated or does not appear to be staying clean (some discharge/ dried blood is normal), you may remove it.  You can shower but should avoid bathing for now  If Non-Breastfeeding Wear a well-fitting bra for support.  Use ice packs to relieve discomfort from engorgement.  Avoid handling your breasts and do not express milk.  Non-breastfeeding engorgement will subside in 24-36 hours.  Uterine Changes After pains, or cramping, are normal. This cramping means that the uterus is contracting to return to its non-pregnant size. The uterus takes 5-6 weeks to return to its non-pregnant size. Vaginal Discharge Usually lasts about 10 days to 4 weeks. The color will change from bright red to brownish to tan and will become less in amount and finally disappear.  Menstruation: your period will resume in approximately 6-8 weeks, unless breastfeeding. Activity Rest! Do not do heavy housework or heavy exercise for two weeks. Avoid driving for 1-2 weeks. Check with your doctor for limitations on activities if you have had a C-Section.  Avoid sexual activity, douching or tampons until your postpartum visit.

## 2016-02-23 NOTE — Lactation Note (Signed)
This note was copied from a baby's chart. Lactation Consultation Note  Patient Name: Girl Thom ChimesCarla Fromme ZOXWR'UToday's Date: 02/23/2016 Reason for consult: Follow-up assessment;Breast/nipple pain Visited with Mom and FOB on day of discharge, baby 2034 hrs old.  Mom complaining about nipple soreness, saying baby doesn't open wide enough.  Assisted with positioning and latching in football hold.  Baby doesn't open wide, but with practice she was able to latch a little better.  Showed Mom and FOB how to gently pull down on chin to uncurl lower lip and open jaw a little wider.  Mom states latch felt more comfortable.  Encouraged alternate breast compression during feedings.  Baby fed for 10 mins and then switched onto right side.  (Noticed a posterior tie, and tongue curls up on edges)  Recommended Mom call us if nipple pain increases or isn't resolved after 2 weeks.  Encouraged her to use EBM on nipple and coconut oil given with instructions on use.  Baby to be fed skin to skin and fed often on cue.  Engorgement prevention and treatment given.  Informed on OP lactation services available.  To call us prn.    Consult Status Consult Status: Complete Date: 02/23/16 Follow-up type: Call as needed    Judee ClaraSmith, Catheline Hixon E 02/23/2016, 12:41 PM

## 2016-02-23 NOTE — Discharge Summary (Signed)
OB Discharge Summary     Patient Name: [name] DOB: 23-Jun-1981 MRN: [id number]  Date of admission: 02/21/2016 Delivering MD: Tilda BurrowFERGUSON, JOHN V   Date of discharge: 02/23/2016  Admitting diagnosis:  39wks contraction and water broke  Intrauterine pregnancy: 2540w3d     Secondary diagnosis:  Active Problems:   Supervision of normal pregnancy   Previous cesarean section complicating pregnancy   Labor and delivery indication for care or intervention  Additional problems: personal h/o VSD     Discharge diagnosis: Term Pregnancy Delivered and VBAC                                                                                                Post partum procedures:none  Augmentation: Foley Balloon  Complications: None  Hospital course:  Onset of Labor With Vaginal Delivery     35 y.o. yo [id number] at 3440w3d was admitted in Latent Labor on 02/21/2016. Patient had an uncomplicated labor course as follows:  Membrane Rupture Time/Date: 5:30 AM ,02/21/2016   Intrapartum Procedures: Episiotomy: None [1]                                         Lacerations:  2nd degree [3];Perineal [11]  Patient had a delivery of a Viable infant. 02/22/2016  Information for the patient's newborn:  Mikeal HawthorneWatson, Girl Jamya [[id number]][[id number]]  Delivery Method: Vaginal, Spontaneous Delivery (Filed from Delivery Summary)    Pateint had an uncomplicated postpartum course.  She is ambulating, tolerating a regular diet, passing flatus, and urinating well. Patient is discharged home in stable condition on 02/23/2016.    Physical exam  Filed Vitals:   02/22/16 0948 02/22/16 1054 02/22/16 1723 02/23/16 0630  BP:  124/68 129/73 104/60  Pulse: 100 100 103 93  Temp: 98.6 F (37 C) 98.6 F (37 C) 98.3 F (36.8 C) 97.7 F (36.5 C)  TempSrc: Oral Oral Oral Oral  Resp:  17 16 18   Height:      Weight:      SpO2:       General: alert, cooperative and no distress  Cardio: blowing systolic murmur appreciated at both LSB  and RSB.  Does not radiate to carotids. Lochia: appropriate Uterine Fundus: firm Incision: N/A DVT Evaluation: No evidence of DVT seen on physical exam. Negative Homan's sign. No cords or calf tenderness. Calf/Ankle edema is present Labs: Lab Results  Component Value Date   WBC 12.8* 02/21/2016   HGB 13.2 02/21/2016   HCT 37.1 02/21/2016   MCV 87.3 02/21/2016   PLT 268 02/21/2016   CMP Latest Ref Rng 05/03/2011  Glucose 70 - 99 mg/dL 78  BUN 6 - 23 mg/dL 8  Creatinine 1.300.50 - 8.651.10 mg/dL 7.840.68  Sodium 696135 - 295145 mEq/L 130(L)  Potassium 3.5 - 5.1 mEq/L 3.4(L)  Chloride 96 - 112 mEq/L 99  CO2 19 - 32 mEq/L 21  Calcium 8.4 - 10.5 mg/dL 9.9  Total Protein 6.0 - 8.3 g/dL 7.0  Total Bilirubin 0.3 - 1.2  mg/dL 0.3  Alkaline Phos 39 - 117 U/L 140(H)  AST 0 - 37 U/L 20  ALT 0 - 35 U/L 20    Discharge instruction: per After Visit Summary and "Baby and Me Booklet".  After visit meds:    Medication List    TAKE these medications        acetaminophen 500 MG tablet  Commonly known as:  TYLENOL  Take 500 mg by mouth every 6 (six) hours as needed for moderate pain.     ibuprofen 600 MG tablet  Commonly known as:  ADVIL,MOTRIN  Take 1 tablet (600 mg total) by mouth every 6 (six) hours.     norethindrone 0.35 MG tablet  Commonly known as:  ORTHO MICRONOR  Take 1 tablet (0.35 mg total) by mouth daily.     prenatal vitamin w/FE, FA 27-1 MG Tabs tablet  Take 1 tablet by mouth daily at 12 noon.        Diet: routine diet  Activity: Advance as tolerated. Pelvic rest for 6 weeks.   Outpatient follow up:4 weeks Follow up Appt:No future appointments. Follow up Visit:No Follow-up on file.  Postpartum contraception: Progesterone only pills  Newborn Data: Live born female  Birth Weight: 7 lb 12.5 oz (3530 g) APGAR: 8, 8  Baby Feeding: Breast Disposition:home with mother   02/23/2016 Delynn Flavin, DO   OB FELLOW DISCHARGE ATTESTATION  I have seen and examined this  patient and agree with above documentation in the resident's note.   Silvano Bilis, MD 8:37 AM

## 2016-02-26 ENCOUNTER — Telehealth: Payer: Self-pay | Admitting: *Deleted

## 2016-02-26 NOTE — Telephone Encounter (Signed)
Pt states is breastfeeding and was [facility] - [facility], [facility]. Informed pt,  Micronor safe while breastfeeding, however recommend no sex until 4-6 weeks after delivery, postpartum scheduled 04/09/2016. Pt states she will not take the birth control at this time due to not being sexual active.

## 2016-03-16 IMAGING — DX DG ANKLE COMPLETE 3+V*L*
3 series · 3 of 3 positions shown · non-contrast
Comparison: None.

CLINICAL DATA: Pain in the left knee status post tripping.

EXAM:
LEFT ANKLE COMPLETE - 3+ VIEW

[ankle ap]
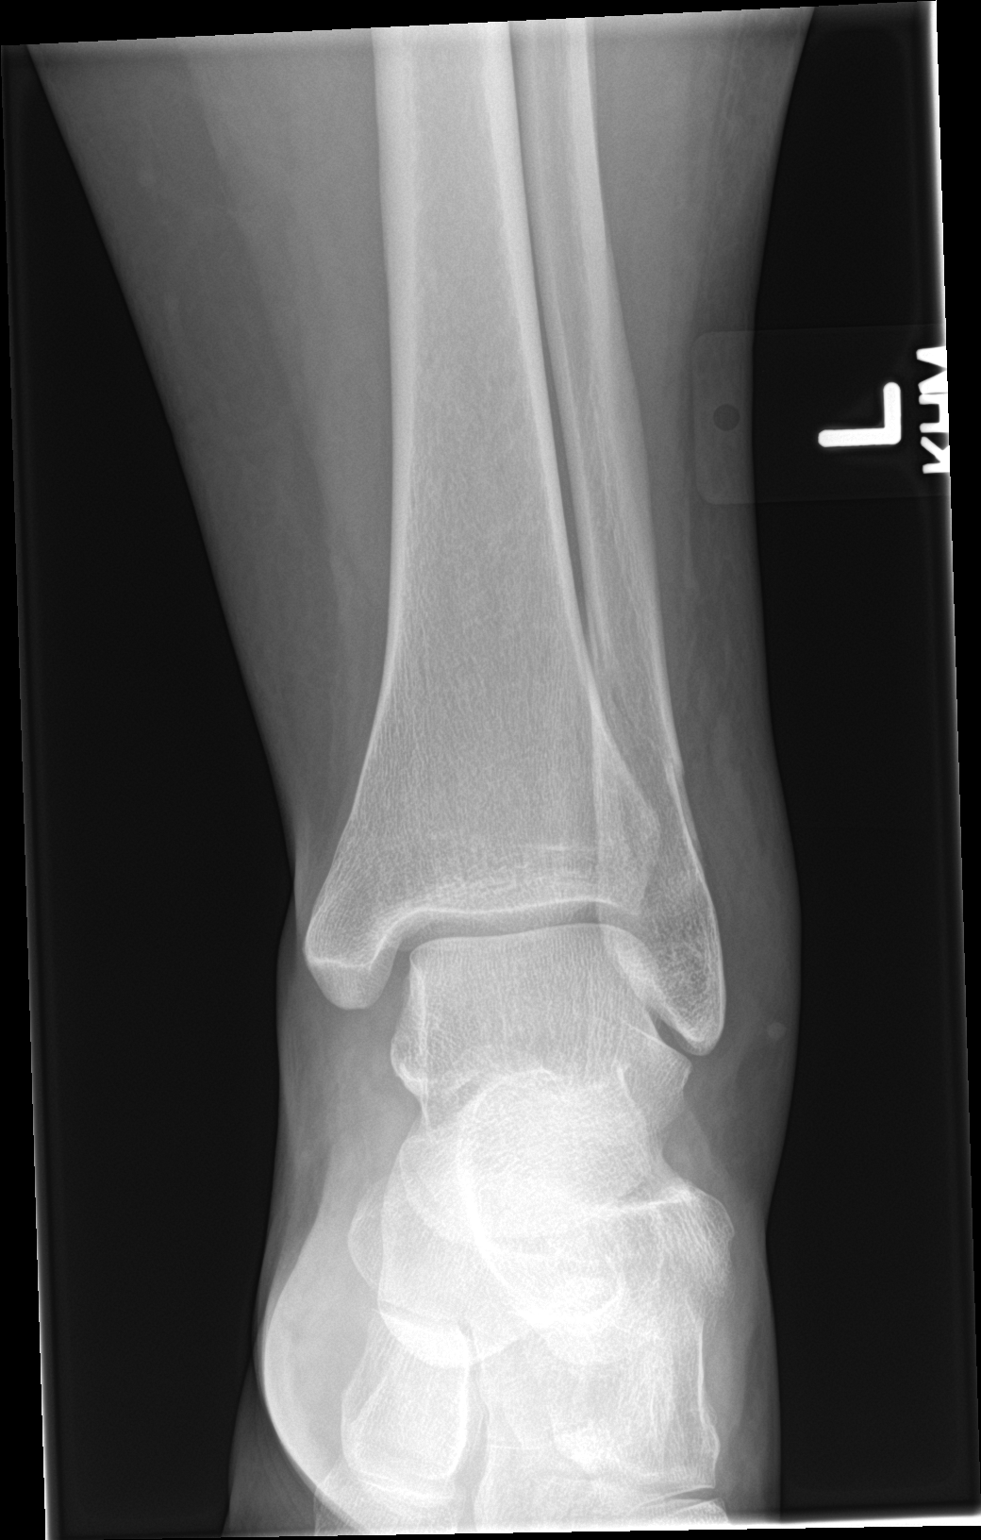

[ankle obl]
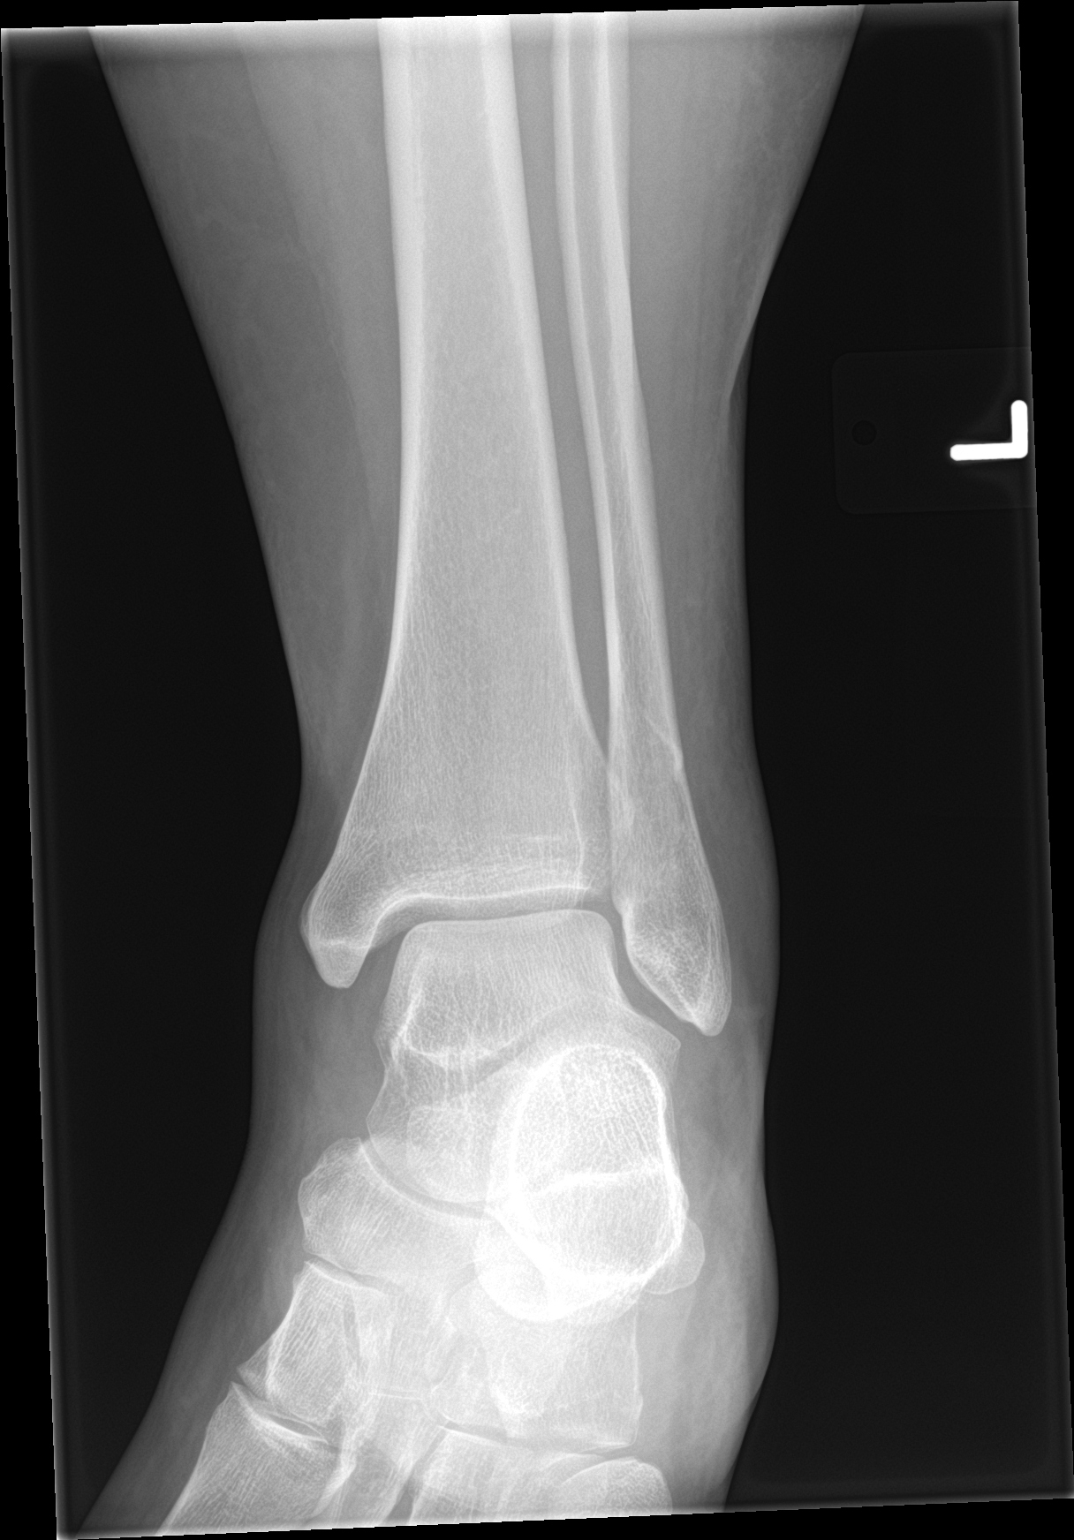

[ankle lat]
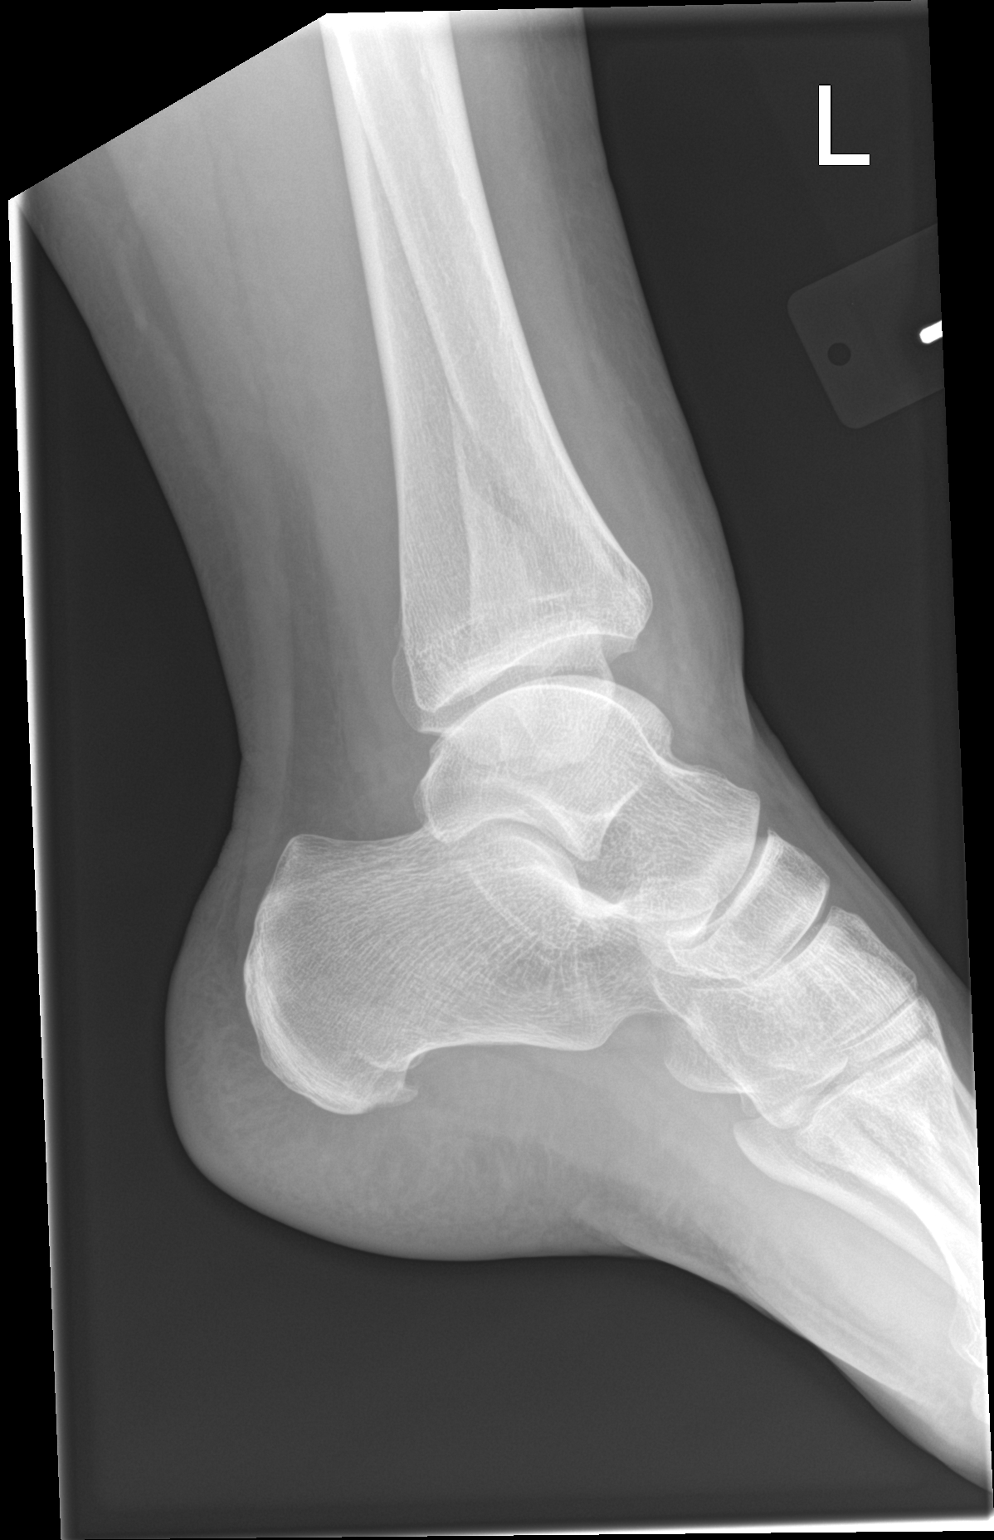

[3 of 3 positions shown; findings below may reference images not displayed]

FINDINGS: There is a spiral mildly displaced fracture of the distal fibular
shaft without evidence of intra-articular extension. There is mild
posterior displacement and 7 mm inferior distruction at the fracture
line. Disruption of the tibiofibular syndesmosis cannot be excluded
with this fracture. Ankle mortise appears intact. There is a lateral
soft tissue swelling.
IMPRESSION: Minimally displaced distracted distal fibular shaft fracture, with
mild posterior displacement.

## 2016-03-18 ENCOUNTER — Telehealth: Payer: Self-pay | Admitting: *Deleted

## 2016-03-18 NOTE — Telephone Encounter (Signed)
Spoke with pt. Pt is breastfeeding and wonders what she can take for allergies. I spoke with Selena BattenKim, CNM and she advised Claritin or Zyrtec is safe. Pt voiced understanding. JSY

## 2016-04-09 ENCOUNTER — Ambulatory Visit (INDEPENDENT_AMBULATORY_CARE_PROVIDER_SITE_OTHER): Payer: PRIVATE HEALTH INSURANCE | Admitting: Advanced Practice Midwife

## 2016-04-09 ENCOUNTER — Encounter: Payer: Self-pay | Admitting: Advanced Practice Midwife

## 2016-04-09 MED ORDER — NORETHINDRONE 0.35 MG PO TABS: 1.0000 | ORAL_TABLET | Freq: Every day | ORAL | Status: DC

## 2016-04-09 NOTE — Progress Notes (Signed)
  [name] is a 35 y.o. who presents for a postpartum visit. She is 6 weeks postpartum following a VBAC. I have fully reviewed the prenatal and intrapartum course. The delivery was at 39.2 gestational weeks.  Anesthesia: epidural. Postpartum course has been unevwntful. Baby's course has been uneventful. Baby is feeding by breast. Bleeding: no bleeding. Bowel function is normal. Bladder function is normal. Patient is not sexually active. Contraception method is oral progesterone-only contraceptive. Postpartum depression screening: negative.   Current outpatient prescriptions:  .  acetaminophen (TYLENOL) 500 MG tablet, Take 500 mg by mouth every 6 (six) hours as needed for moderate pain. , Disp: , Rfl:  .  loratadine (CLARITIN) 10 MG tablet, Take 10 mg by mouth daily as needed for allergies., Disp: , Rfl:  .  prenatal vitamin w/FE, FA (PRENATAL 1 + 1) 27-1 MG TABS tablet, Take 1 tablet by mouth daily at 12 noon., Disp: 30 each, Rfl: 11 .  norethindrone (ORTHO MICRONOR) 0.35 MG tablet, Take 1 tablet (0.35 mg total) by mouth daily. (Patient not taking: Reported on 04/09/2016), Disp: 1 Package, Rfl: 11  Review of Systems   Constitutional: Negative for fever and chills Eyes: Negative for visual disturbances Respiratory: Negative for shortness of breath, dyspnea Cardiovascular: Negative for chest pain or palpitations  Gastrointestinal: Negative for vomiting, diarrhea and constipation Genitourinary: Negative for dysuria and urgency Musculoskeletal: Negative for back pain, joint pain, myalgias  Neurological: Negative for dizziness and headaches   Objective:     Filed Vitals:   04/09/16 1048  BP: 120/80   General:  alert, cooperative and no distress   Breasts:  negative  Lungs: clear to auscultation bilaterally  Heart:  regular rate and rhythm  Abdomen: Soft, nontender   Vulva:  normal  Vagina: normal vagina  Cervix:  closed  Corpus: Well involuted     Rectal Exam: no hemorrhoids       Assessment:    normal postpartum exam.  Plan:    1. Contraception: oral progesterone-only contraceptive 2. Follow up in:   or as needed.

## 2016-05-06 ENCOUNTER — Telehealth: Payer: Self-pay | Admitting: Advanced Practice Midwife

## 2016-05-06 MED ORDER — NORETHINDRONE 0.35 MG PO TABS: 1.0000 | ORAL_TABLET | Freq: Every day | ORAL | Status: DC

## 2016-05-06 NOTE — Telephone Encounter (Signed)
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

## 2016-07-04 ENCOUNTER — Other Ambulatory Visit: Payer: Self-pay | Admitting: *Deleted

## 2016-07-05 MED ORDER — PRENATAL PLUS 27-1 MG PO TABS
1.0000 | ORAL_TABLET | Freq: Every day | ORAL | 11 refills | Status: DC
Start: 2016-07-05 — End: 2017-05-30

## 2016-08-02 ENCOUNTER — Telehealth: Payer: Self-pay | Admitting: Adult Health

## 2016-08-02 NOTE — Telephone Encounter (Signed)
Pt called stating if it was safe for her to take Diflucan while she is breast feeding, Spoke with nurse and it is not save for pt to take while breast feeding. Pt would like a call back to see what she can take while she is breast feeding. Please contact pt

## 2016-08-02 NOTE — Telephone Encounter (Signed)
Pt informed can take OTC Monistat while breastfeeding for a vaginal yeast infection. Pt verbalized understanding.

## 2016-08-14 ENCOUNTER — Telehealth: Payer: Self-pay | Admitting: *Deleted

## 2016-08-14 NOTE — Telephone Encounter (Signed)
Pt informed per Cyril MourningJennifer Griffin, NP safe to get flu vaccine while breastfeeding. Pt verbalized understanding.

## 2017-03-31 ENCOUNTER — Other Ambulatory Visit: Payer: Self-pay | Admitting: Women's Health

## 2017-04-28 ENCOUNTER — Other Ambulatory Visit: Payer: Self-pay | Admitting: Women's Health

## 2017-05-30 ENCOUNTER — Other Ambulatory Visit (HOSPITAL_COMMUNITY)
Admission: RE | Admit: 2017-05-30 | Discharge: 2017-05-30 | Disposition: A | Discharge status: Home / Self Care | Payer: No Typology Code available for payment source | Source: Ambulatory Visit | Attending: Obstetrics & Gynecology | Admitting: Obstetrics & Gynecology

## 2017-05-30 ENCOUNTER — Ambulatory Visit (INDEPENDENT_AMBULATORY_CARE_PROVIDER_SITE_OTHER): Payer: PRIVATE HEALTH INSURANCE | Admitting: Obstetrics & Gynecology

## 2017-05-30 ENCOUNTER — Encounter: Payer: Self-pay | Admitting: Obstetrics & Gynecology

## 2017-05-30 VITALS — BP 132/70 | HR 95 | Ht 66.0 in | Wt 222.0 lb

## 2017-05-30 DIAGNOSIS — Z01419 Encounter for gynecological examination (general) (routine) without abnormal findings: Secondary | ICD-10-CM

## 2017-05-30 MED ORDER — NORETHINDRONE 0.35 MG PO TABS: 1.0000 | ORAL_TABLET | Freq: Every day | ORAL | 12 refills | Status: DC

## 2017-05-30 NOTE — Progress Notes (Signed)
. Subjective:     [name] is a 36 y.o. female here for a routine exam.  Patient's last menstrual period was 05/15/2017. [id number] Birth Control Method:  Progesterone only pills Menstrual Calendar(currently): regular  Current complaints: none.   Current acute medical issues:  none   Recent Gynecologic History Patient's last menstrual period was 05/15/2017. Last Pap: 2017,  normal Last mammogram: n/a    Past Medical History:  Diagnosis Date  . Pregnant 07/11/2015  . 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 CESAREAN SECTION    . WISDOM TOOTH EXTRACTION     2005    OB History    Gravida Para Term Preterm AB Living   2 2 2     2    SAB TAB Ectopic Multiple Live Births         0 2      Social History   Social History  . Marital status: Married    Spouse name: N/A  . Number of children: N/A  . Years of education: N/A   Social History Main Topics  . Smoking status: Never Smoker  . Smokeless tobacco: Never Used  . Alcohol use 0.0 oz/week     Comment: occ  . Drug use: No  . Sexual activity: Yes    Birth control/ protection: Pill   Other Topics Concern  . None   Social History Narrative  . None    Family History  Problem Relation Age of Onset  . Hypertension Mother   . Hypertension Father   . Macular degeneration Father   . Stroke Paternal Grandmother   . Stroke Maternal Grandmother   . Cancer Maternal Grandfather        lung  . ALS Paternal Grandfather   . Cancer Other        colon-maternal great grandma     Current Outpatient Prescriptions:  .  loratadine (CLARITIN) 10 MG tablet, Take 10 mg by mouth daily as needed for allergies., Disp: , Rfl:  .  norethindrone ([name]) 0.35 MG tablet, Take 1 tablet (0.35 mg total) by mouth daily., Disp: 1 Package, Rfl: 12  Review of Systems  Review of Systems  Constitutional: Negative  for fever, chills, weight loss, malaise/fatigue and diaphoresis.  HENT: Negative for hearing loss, ear pain, nosebleeds, congestion, sore throat, neck pain, tinnitus and ear discharge.   Eyes: Negative for blurred vision, double vision, photophobia, pain, discharge and redness.  Respiratory: Negative for cough, hemoptysis, sputum production, shortness of breath, wheezing and stridor.   Cardiovascular: Negative for chest pain, palpitations, orthopnea, claudication, leg swelling and PND.  Gastrointestinal: negative for abdominal pain. Negative for heartburn, nausea, vomiting, diarrhea, constipation, blood in stool and melena.  Genitourinary: Negative for dysuria, urgency, frequency, hematuria and flank pain.  Musculoskeletal: Negative for myalgias, back pain, joint pain and falls.  Skin: Negative for itching and rash.  Neurological: Negative for dizziness, tingling, tremors, sensory change, speech change, focal weakness, seizures, loss of consciousness, weakness and headaches.  Endo/Heme/Allergies: Negative for environmental allergies and polydipsia. Does not bruise/bleed easily.  Psychiatric/Behavioral: Negative for depression, suicidal ideas, hallucinations, memory loss and substance abuse. The patient is not nervous/anxious and does not have insomnia.        Objective:  Blood pressure 132/70, pulse 95, height 5\' 6"  (1.676 m), weight 222 lb (100.7 kg), last menstrual period 05/15/2017, not  currently breastfeeding.   Physical Exam  Vitals reviewed. Constitutional: She is oriented to person, place, and time. She appears well-developed and well-nourished.  HENT:  Head: Normocephalic and atraumatic.        Right Ear: External ear normal.  Left Ear: External ear normal.  Nose: Nose normal.  Mouth/Throat: Oropharynx is clear and moist.  Eyes: Conjunctivae and EOM are normal. Pupils are equal, round, and reactive to light. Right eye exhibits no discharge. Left eye exhibits no discharge. No scleral  icterus.  Neck: Normal range of motion. Neck supple. No tracheal deviation present. No thyromegaly present.  Cardiovascular: III/VI SEM due to VSD .  Exam reveals no gallop and no friction rub.   No murmur heard. Respiratory: Effort normal and breath sounds normal. No respiratory distress. She has no wheezes. She has no rales. She exhibits no tenderness.  GI: Soft. Bowel sounds are normal. She exhibits no distension and no mass. There is no tenderness. There is no rebound and no guarding.  Genitourinary:  Breasts no masses skin changes or nipple changes bilaterally      Vulva is normal without lesions Vagina is pink moist without discharge Cervix normal in appearance and pap is done Uterus is normal size shape and contour Adnexa is negative with normal sized ovaries   Musculoskeletal: Normal range of motion. She exhibits no edema and no tenderness.  Neurological: She is alert and oriented to person, place, and time. She has normal reflexes. She displays normal reflexes. No cranial nerve deficit. She exhibits normal muscle tone. Coordination normal.  Skin: Skin is warm and dry. No rash noted. No erythema. No pallor.  Psychiatric: She has a normal mood and affect. Her behavior is normal. Judgment and thought content normal.       Medications Ordered at today's visit: Meds ordered this encounter  Medications  . DISCONTD: norethindrone ([name]) 0.35 MG tablet    Sig: Take 1 tablet (0.35 mg total) by mouth daily.    Dispense:  1 Package    Refill:  12    NEEDS PAP & PHYSICAL PRIOR TO ANY FURTHER REFILLS, THIS IS THE 2ND NOTICE. WILL NOT REFILL ANYMORE  . norethindrone ([name]) 0.35 MG tablet    Sig: Take 1 tablet (0.35 mg total) by mouth daily.    Dispense:  1 Package    Refill:  12    Other orders placed at today's visit: No orders of the defined types were placed in this encounter.     Assessment:    Healthy female exam.    Plan:    Contraception: oral progesterone-only  contraceptive.     Return in about 1 year (around 05/30/2018) for yearly, with Dr Despina Hidden.

## 2017-06-04 LAB — CYTOLOGY - PAP
DIAGNOSIS: NEGATIVE
HPV (WINDOPATH): NOT DETECTED

## 2018-03-29 ENCOUNTER — Ambulatory Visit
Admission: EM | Admit: 2018-03-29 | Discharge: 2018-03-29 | Disposition: A | Discharge status: Home / Self Care | Payer: No Typology Code available for payment source | Attending: Family Medicine | Admitting: Family Medicine

## 2018-03-29 ENCOUNTER — Other Ambulatory Visit: Payer: Self-pay

## 2018-03-29 DIAGNOSIS — J019 Acute sinusitis, unspecified: Secondary | ICD-10-CM

## 2018-03-29 HISTORY — DX: Other allergy status, other than to drugs and biological substances: Z91.09

## 2018-03-29 MED ORDER — IPRATROPIUM BROMIDE 0.06 % NA SOLN: 2.0000 | Freq: Four times a day (QID) | NASAL | 0 refills | Status: DC

## 2018-03-29 MED ORDER — AMOXICILLIN-POT CLAVULANATE 875-125 MG PO TABS: 1.0000 | ORAL_TABLET | Freq: Two times a day (BID) | ORAL | 0 refills | Status: DC

## 2018-03-29 NOTE — Discharge Instructions (Signed)
Meds as prescribed. ° °Take care ° °Dr. Thoma Paulsen  °

## 2018-03-29 NOTE — ED Triage Notes (Signed)
Pt with sx starting on Friday of head pressure, pressure around eyes, otalgia, drainage down back of throat. Pain 4/10

## 2018-03-29 NOTE — ED Provider Notes (Signed)
[facility]    CSN: [id number] Arrival date & time: 03/29/18  1114   History   Chief Complaint Chief Complaint  Patient presents with  . Facial Pain  . Otalgia   HPI  37 year old female presents with concerns for sinusitis.  Patient reports severe symptoms 2 days ago.  Congestion, sinus pain and pressure, postnasal drip, sore throat.  Pain is currently mild to moderate.  No associated fever.  No improvement with over-the-counter Zyrtec.  No known exacerbating factors.  No reported sick contacts.  No other associated symptoms.  No other complaints.  Past Medical History:  Diagnosis Date  . Environmental allergies   . Pregnant 07/11/2015  . 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 Labor and delivery indication for care or intervention 02/21/2016  . Supervision of normal pregnancy 08/04/2015  . H/O VSD (ventricular septal defect) 08/04/2015  . Previous cesarean section complicating pregnancy 08/04/2015    Past Surgical History:  Procedure Laterality Date  . CESAREAN SECTION    . WISDOM TOOTH EXTRACTION     2005    OB History    Gravida  2   Para  2   Term  2   Preterm      AB      Living  2     SAB      TAB      Ectopic      Multiple  0   Live Births  2            Home Medications    Prior to Admission medications   Medication Sig Start Date End Date Taking? Authorizing Provider  amoxicillin-clavulanate (AUGMENTIN) 875-125 MG tablet Take 1 tablet by mouth every 12 (twelve) hours. 03/29/18   Everlene Other G, DO  ipratropium (ATROVENT) 0.06 % nasal spray Place 2 sprays into both nostrils 4 (four) times daily. 03/29/18   Tommie Sams, DO  loratadine (CLARITIN) 10 MG tablet Take 10 mg by mouth daily as needed for allergies.    [provider]  norethindrone ([name]) 0.35 MG tablet Take 1 tablet (0.35 mg  total) by mouth daily. 05/30/17   Lazaro Arms, MD    Family History Family History  Problem Relation Age of Onset  . Hypertension Mother   . Hypertension Father   . Macular degeneration Father   . Stroke Paternal Grandmother   . Stroke Maternal Grandmother   . Cancer Maternal Grandfather        lung  . ALS Paternal Grandfather   . Cancer Other        colon-maternal great grandma    Social History Social History   Tobacco Use  . Smoking status: Never Smoker  . Smokeless tobacco: Never Used  Substance Use Topics  . Alcohol use: Yes    Alcohol/week: 0.0 oz    Comment: occ  . Drug use: No    Allergies   Patient has no known allergies.   Review of Systems Review of Systems  Constitutional: Negative for fever.  HENT: Positive for congestion, postnasal drip, sinus pressure, sinus pain and sore throat.    Physical Exam Triage Vital Signs ED Triage Vitals  Enc Vitals Group     BP 03/29/18 1124 119/73     Pulse Rate 03/29/18 1124 89     Resp 03/29/18 1124 18  Temp 03/29/18 1124 98.5 F (36.9 C)     Temp Source 03/29/18 1124 Oral     SpO2 03/29/18 1124 100 %     Weight 03/29/18 1123 210 lb (95.3 kg)     Height 03/29/18 1123  (1.702 m)     Head Circumference --      Peak Flow --      Pain Score 03/29/18 1123 4     Pain Loc --      Pain Edu? --      Excl. in GC? --    Updated Vital Signs BP 119/73 (BP Location: Left Arm)   Pulse 89   Temp 98.5 F (36.9 C) (Oral)   Resp 18   Ht  (1.702 m)   Wt 210 lb (95.3 kg)   LMP 03/11/2018   SpO2 100%   BMI 32.89 kg/m     Physical Exam  Constitutional: She is oriented to person, place, and time. She appears well-developed. No distress.  HENT:  Head: Normocephalic and atraumatic.  Normal TMs bilaterally.  Severe oropharyngeal erythema.  Cardiovascular: Normal rate and regular rhythm.  Murmur heard. Pulmonary/Chest: Effort normal and breath sounds normal. She has no wheezes. She has no rales.    Neurological: She is alert and oriented to person, place, and time.  Psychiatric: She has a normal mood and affect. Her behavior is normal.  Nursing note and vitals reviewed.  UC Treatments / Results  Labs (all labs ordered are listed, but only abnormal results are displayed) Labs Reviewed - No data to display  EKG None  Radiology No results found.  Procedures Procedures (including critical care time)  Medications Ordered in UC Medications - No data to display  Initial Impression / Assessment and Plan / UC Course  I have reviewed the triage vital signs and the nursing notes.  Pertinent labs & imaging results that were available during my care of the patient were reviewed by me and considered in my medical decision making (see chart for details).    37 year old female presents with sinusitis.  Given severity of symptoms, placing on Augmentin.  Also treating with Atrovent nasal spray.  Final Clinical Impressions(s) / UC Diagnoses   Final diagnoses:  Acute sinusitis, recurrence not specified, unspecified location     Discharge Instructions     Meds as prescribed.  Take care  Dr. Adriana Simas    ED Prescriptions    Medication Sig Dispense Auth. Provider   ipratropium (ATROVENT) 0.06 % nasal spray Place 2 sprays into both nostrils 4 (four) times daily. 15 mL Hiilei Gerst G, DO   amoxicillin-clavulanate (AUGMENTIN) 875-125 MG tablet Take 1 tablet by mouth every 12 (twelve) hours. 14 tablet Tommie Sams, DO     Controlled Substance Prescriptions [place] Controlled Substance Registry consulted? Not Applicable   Tommie Sams, DO 03/29/18 1240

## 2018-05-25 ENCOUNTER — Other Ambulatory Visit: Payer: Self-pay | Admitting: Obstetrics & Gynecology

## 2018-05-25 ENCOUNTER — Telehealth: Payer: Self-pay | Admitting: *Deleted

## 2018-05-25 NOTE — Telephone Encounter (Signed)
done

## 2018-05-25 NOTE — Telephone Encounter (Signed)
Refill request on BCP.

## 2018-06-12 ENCOUNTER — Other Ambulatory Visit: Payer: PRIVATE HEALTH INSURANCE | Admitting: Obstetrics & Gynecology

## 2018-07-24 ENCOUNTER — Ambulatory Visit (INDEPENDENT_AMBULATORY_CARE_PROVIDER_SITE_OTHER): Payer: PRIVATE HEALTH INSURANCE | Admitting: Obstetrics & Gynecology

## 2018-07-24 ENCOUNTER — Other Ambulatory Visit (HOSPITAL_COMMUNITY)
Admission: RE | Admit: 2018-07-24 | Discharge: 2018-07-24 | Disposition: A | Discharge status: Home / Self Care | Payer: PRIVATE HEALTH INSURANCE | Source: Ambulatory Visit | Attending: Obstetrics & Gynecology | Admitting: Obstetrics & Gynecology

## 2018-07-24 ENCOUNTER — Encounter: Payer: Self-pay | Admitting: Obstetrics & Gynecology

## 2018-07-24 VITALS — BP 130/75 | HR 83 | Ht 66.0 in | Wt 232.5 lb

## 2018-07-24 DIAGNOSIS — Z01419 Encounter for gynecological examination (general) (routine) without abnormal findings: Secondary | ICD-10-CM | POA: Diagnosis present

## 2018-07-24 MED ORDER — NORETHINDRONE 0.35 MG PO TABS: 1.0000 | ORAL_TABLET | Freq: Every day | ORAL | 12 refills | Status: DC

## 2018-07-24 NOTE — Progress Notes (Signed)
Subjective:     [name] is a 37 y.o. female here for a routine exam.  Patient's last menstrual period was 07/13/2018. [id number] Birth Control Method:  OCP progesterone only Menstrual Calendar(currently): regular  Current complaints: none.   Current acute medical issues:  none   Recent Gynecologic History Patient's last menstrual period was 07/13/2018. Last Pap: 2018,  normal Last mammogram: ,    Past Medical History:  Diagnosis Date  . Environmental allergies   . Pregnant 07/11/2015  . 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 CESAREAN SECTION    . WISDOM TOOTH EXTRACTION     2005    OB History    Gravida  2   Para  2   Term  2   Preterm      AB      Living  2     SAB      TAB      Ectopic      Multiple  0   Live Births  2           Social History   Socioeconomic History  . Marital status: Married    Spouse name: Not on file  . Number of children: Not on file  . Years of education: Not on file  . Highest education level: Not on file  Occupational History  . Not on file  Social Needs  . Financial resource strain: Not on file  . Food insecurity:    Worry: Not on file    Inability: Not on file  . Transportation needs:    Medical: Not on file    Non-medical: Not on file  Tobacco Use  . Smoking status: Never Smoker  . Smokeless tobacco: Never Used  Substance and Sexual Activity  . Alcohol use: Yes    Alcohol/week: 0.0 standard drinks    Comment: occ  . Drug use: No  . Sexual activity: Yes    Birth control/protection: Pill  Lifestyle  . Physical activity:    Days per week: Not on file    Minutes per session: Not on file  . Stress: Not on file  Relationships  . Social connections:    Talks on phone: Not on file    Gets together: Not on file    Attends religious service: Not on file    Active member of  club or organization: Not on file    Attends meetings of clubs or organizations: Not on file    Relationship status: Not on file  Other Topics Concern  . Not on file  Social History Narrative  . Not on file    Family History  Problem Relation Age of Onset  . Hypertension Mother   . Hypertension Father   . Macular degeneration Father   . Stroke Paternal Grandmother   . Stroke Maternal Grandmother   . Cancer Maternal Grandfather        lung  . ALS Paternal Grandfather   . Cancer Other        colon-maternal great grandma     Current Outpatient Medications:  .  loratadine (CLARITIN) 10 MG tablet, Take 10 mg by mouth daily as needed for allergies., Disp: , Rfl:  .  norethindrone ([name]) 0.35 MG tablet, Take 1 tablet by mouth daily., Disp: , Rfl:   Review of Systems  Review of  Systems  Constitutional: Negative for fever, chills, weight loss, malaise/fatigue and diaphoresis.  HENT: Negative for hearing loss, ear pain, nosebleeds, congestion, sore throat, neck pain, tinnitus and ear discharge.   Eyes: Negative for blurred vision, double vision, photophobia, pain, discharge and redness.  Respiratory: Negative for cough, hemoptysis, sputum production, shortness of breath, wheezing and stridor.   Cardiovascular: Negative for chest pain, palpitations, orthopnea, claudication, leg swelling and PND.  Gastrointestinal: negative for abdominal pain. Negative for heartburn, nausea, vomiting, diarrhea, constipation, blood in stool and melena.  Genitourinary: Negative for dysuria, urgency, frequency, hematuria and flank pain.  Musculoskeletal: Negative for myalgias, back pain, joint pain and falls.  Skin: Negative for itching and rash.  Neurological: Negative for dizziness, tingling, tremors, sensory change, speech change, focal weakness, seizures, loss of consciousness, weakness and headaches.  Endo/Heme/Allergies: Negative for environmental allergies and polydipsia. Does not bruise/bleed  easily.  Psychiatric/Behavioral: Negative for depression, suicidal ideas, hallucinations, memory loss and substance abuse. The patient is not nervous/anxious and does not have insomnia.        Objective:  Blood pressure 130/75, pulse 83, height 5\' 6"  (1.676 m), weight 232 lb 8 oz (105.5 kg), last menstrual period 07/13/2018.   Physical Exam  Vitals reviewed. Constitutional: She is oriented to person, place, and time. She appears well-developed and well-nourished.  HENT:  Head: Normocephalic and atraumatic.        Right Ear: External ear normal.  Left Ear: External ear normal.  Nose: Nose normal.  Mouth/Throat: Oropharynx is clear and moist.  Eyes: Conjunctivae and EOM are normal. Pupils are equal, round, and reactive to light. Right eye exhibits no discharge. Left eye exhibits no discharge. No scleral icterus.  Neck: Normal range of motion. Neck supple. No tracheal deviation present. No thyromegaly present.  Cardiovascular: Normal rate, regular rhythm, normal heart sounds and intact distal pulses.  Exam reveals no gallop and no friction rub.   III/Vi SEM noted Respiratory: Effort normal and breath sounds normal. No respiratory distress. She has no wheezes. She has no rales. She exhibits no tenderness.  GI: Soft. Bowel sounds are normal. She exhibits no distension and no mass. There is no tenderness. There is no rebound and no guarding.  Genitourinary:  Breasts no masses skin changes or nipple changes bilaterally      Vulva is normal without lesions Vagina is pink moist without discharge Cervix normal in appearance and pap is done Uterus is normal size shape and contour Adnexa is negative with normal sized ovaries   Musculoskeletal: Normal range of motion. She exhibits no edema and no tenderness.  Neurological: She is alert and oriented to person, place, and time. She has normal reflexes. She displays normal reflexes. No cranial nerve deficit. She exhibits normal muscle tone.  Coordination normal.  Skin: Skin is warm and dry. No rash noted. No erythema. No pallor.  Psychiatric: She has a normal mood and affect. Her behavior is normal. Judgment and thought content normal.       Medications Ordered at today's visit: No orders of the defined types were placed in this encounter.   Other orders placed at today's visit: No orders of the defined types were placed in this encounter.     Assessment:    Healthy female exam.    Plan:    Contraception: oral progesterone-only contraceptive. Follow up in: 2 years.     Return in about 2 years (around 07/24/2020) for yearly.

## 2018-07-27 LAB — CYTOLOGY - PAP
Diagnosis: NEGATIVE
HPV (WINDOPATH): NOT DETECTED

## 2019-08-16 ENCOUNTER — Other Ambulatory Visit: Payer: Self-pay | Admitting: Obstetrics & Gynecology

## 2019-10-12 ENCOUNTER — Telehealth: Payer: Self-pay | Admitting: *Deleted

## 2019-10-12 NOTE — Telephone Encounter (Signed)
Pt is having issues during her cycle. She wonders if it's from her birth control. Please call. Thanks!!

## 2019-10-12 NOTE — Telephone Encounter (Addendum)
Last month during cycle, pt noticed she was passing more clots and gushing blood at times. Also had lower back pain. This month, pt noticed the same thing. Steady amount of blood wasn't as much as last month. Everything with birth control has been fine up until last month. What do you advise? Thanks!! [place]

## 2019-10-12 NOTE — Telephone Encounter (Signed)
Left message @ 2:38 pm. JSY

## 2019-10-13 NOTE — Telephone Encounter (Signed)
Pt advised of Dr. Brynda Greathouse recommendations and voiced understanding. She will call tomorrow to schedule appt. [place]

## 2019-10-13 NOTE — Telephone Encounter (Signed)
If this is an abrupt change she needs an ultrasound and then an office visit to see me after

## 2019-10-27 ENCOUNTER — Other Ambulatory Visit: Payer: Self-pay | Admitting: Obstetrics & Gynecology

## 2019-10-27 DIAGNOSIS — N939 Abnormal uterine and vaginal bleeding, unspecified: Secondary | ICD-10-CM

## 2019-10-29 ENCOUNTER — Encounter: Payer: Self-pay | Admitting: Obstetrics & Gynecology

## 2019-10-29 ENCOUNTER — Ambulatory Visit (INDEPENDENT_AMBULATORY_CARE_PROVIDER_SITE_OTHER): Payer: PRIVATE HEALTH INSURANCE

## 2019-10-29 ENCOUNTER — Ambulatory Visit (INDEPENDENT_AMBULATORY_CARE_PROVIDER_SITE_OTHER): Payer: PRIVATE HEALTH INSURANCE | Admitting: Obstetrics & Gynecology

## 2019-10-29 ENCOUNTER — Other Ambulatory Visit: Payer: Self-pay

## 2019-10-29 VITALS — BP 140/93 | HR 75 | Ht 66.0 in | Wt 236.0 lb

## 2019-10-29 DIAGNOSIS — N938 Other specified abnormal uterine and vaginal bleeding: Secondary | ICD-10-CM

## 2019-10-29 DIAGNOSIS — N939 Abnormal uterine and vaginal bleeding, unspecified: Secondary | ICD-10-CM

## 2019-10-29 MED ORDER — DESOGESTREL-ETHINYL ESTRADIOL 0.15-30 MG-MCG PO TABS: 1.0000 | ORAL_TABLET | Freq: Every day | ORAL | 11 refills | Status: DC

## 2019-10-29 MED ORDER — MEGESTROL ACETATE 40 MG PO TABS: ORAL_TABLET | ORAL | 0 refills | Status: DC

## 2019-10-29 NOTE — Progress Notes (Addendum)
PELVIC US TA/TV:homogeneous anteverted uterus,wnl,EEC 7.6 mm,normal ovaries,ovaries appear mobile,no free fluid,no pain during ultrasound,chaperone: Marcie Bal

## 2019-10-29 NOTE — Progress Notes (Signed)
Follow up appointment for results  Chief Complaint  Patient presents with  . Menorrhagia    [place] today    Blood pressure (!) 140/93, pulse 75, height 5\' 6"  (1.676 m), weight 236 lb (107 kg), last menstrual period 10/11/2019.  10/13/2019 Transvaginal Non-OB  Result Date: 10/29/2019 GYNECOLOGIC SONOGRAM [name] is a 38 y.o. 31 LMP 10/11/2019 she is here for a pelvic sonogram for abnormal uterine bleeding. Uterus                      9 x 4.9 x 6.2 cm, Total uterine volume 141 cc,homogeneous anteverted uterus,wnl Endometrium          7.6 mm, symmetrical, wnl Right ovary             3.1 x 1.9 x 2 cm, wnl Left ovary                3.8 x 3.1 x 2.3 cm, wnl No free fluid Technician Comments: PELVIC 10/13/2019 TA/TV:homogeneous anteverted uterus,wnl,EEC 7.6 mm,normal ovaries,ovaries appear mobile,no free fluid,no pain during ultrasound [place] 10/29/2019 9:11 AM Clinical Impression and recommendations: I have reviewed the sonogram results above, combined with the patient's current clinical course, below are my impressions and any appropriate recommendations for management based on the sonographic findings. Uterus is normal size shape contour no pathology Endometrium is normal Both ovaries are normal 10/31/2019 10/29/2019 9:24 AM  10/31/2019 Pelvis Complete  Result Date: 10/29/2019 GYNECOLOGIC SONOGRAM [name] is a 38 y.o. 31 LMP 10/11/2019 she is here for a pelvic sonogram for abnormal uterine bleeding. Uterus                      9 x 4.9 x 6.2 cm, Total uterine volume 141 cc,homogeneous anteverted uterus,wnl Endometrium          7.6 mm, symmetrical, wnl Right ovary             3.1 x 1.9 x 2 cm, wnl Left ovary                3.8 x 3.1 x 2.3 cm, wnl No free fluid Technician Comments: PELVIC 10/13/2019 TA/TV:homogeneous anteverted uterus,wnl,EEC 7.6 mm,normal ovaries,ovaries appear mobile,no free fluid,no pain during ultrasound [place] 10/29/2019 9:11 AM Clinical Impression and recommendations: I have  reviewed the sonogram results above, combined with the patient's current clinical course, below are my impressions and any appropriate recommendations for management based on the sonographic findings. Uterus is normal size shape contour no pathology Endometrium is normal Both ovaries are normal 10/31/2019 10/29/2019 9:24 AM     MEDS ordered this encounter: Meds ordered this encounter  Medications  . megestrol (MEGACE) 40 MG tablet    Sig: 3 tablets a day for 5 days, 2 tablets a day for 5 days then 1 tablet daily    Dispense:  45 tablet    Refill:  0  . desogestrel-ethinyl estradiol (APRI) 0.15-30 MG-MCG tablet    Sig: Take 1 tablet by mouth daily.    Dispense:  1 Package    Refill:  11    Orders for this encounter: No orders of the defined types were placed in this encounter.   Impression:   ICD-10-CM   1. DUB (dysfunctional uterine bleeding), due to progesterone only pills  N93.8      Plan: Megestrol for endometrial synchronization followed by beginning more traditional pills, desogestrel based with 30 mics  EE  Follow Up: Return if symptoms worsen or fail to improve.       Face to face time:  10 minutes  Greater than 50% of the visit time was spent in counseling and coordination of care with the patient.  The summary and outline of the counseling and care coordination is summarized in the note above.   All questions were answered.  Past Medical History:  Diagnosis Date  . Environmental allergies   . Pregnant 07/11/2015  . 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 CESAREAN SECTION    . WISDOM TOOTH EXTRACTION     2005    OB History    Gravida  2   Para  2   Term  2   Preterm      AB      Living  2     SAB      TAB      Ectopic      Multiple  0   Live Births  2           No Known Allergies  Social History    Socioeconomic History  . Marital status: Married    Spouse name: Not on file  . Number of children: Not on file  . Years of education: Not on file  . Highest education level: Not on file  Occupational History  . Not on file  Tobacco Use  . Smoking status: Never Smoker  . Smokeless tobacco: Never Used  Substance and Sexual Activity  . Alcohol use: Yes    Alcohol/week: 0.0 standard drinks    Comment: occ  . Drug use: No  . Sexual activity: Yes    Birth control/protection: Pill  Other Topics Concern  . Not on file  Social History Narrative  . Not on file   Social Determinants of Health   Financial Resource Strain:   . Difficulty of Paying Living Expenses: Not on file  Food Insecurity:   . Worried About Charity fundraiser in the Last Year: Not on file  . Ran Out of Food in the Last Year: Not on file  Transportation Needs:   . Lack of Transportation (Medical): Not on file  . Lack of Transportation (Non-Medical): Not on file  Physical Activity:   . Days of Exercise per Week: Not on file  . Minutes of Exercise per Session: Not on file  Stress:   . Feeling of Stress : Not on file  Social Connections:   . Frequency of Communication with Friends and Family: Not on file  . Frequency of Social Gatherings with Friends and Family: Not on file  . Attends Religious Services: Not on file  . Active Member of Clubs or Organizations: Not on file  . Attends Archivist Meetings: Not on file  . Marital Status: Not on file    Family History  Problem Relation Age of Onset  . Hypertension Mother   . Hypertension Father   . Macular degeneration Father   . Stroke Paternal Grandmother   . Stroke Maternal Grandmother   . Cancer Maternal Grandfather        lung  . ALS Paternal Grandfather   . Cancer Other        colon-maternal great grandma

## 2019-11-24 ENCOUNTER — Other Ambulatory Visit: Payer: PRIVATE HEALTH INSURANCE

## 2020-03-20 ENCOUNTER — Telehealth: Payer: Self-pay | Admitting: Obstetrics and Gynecology

## 2020-03-20 NOTE — Telephone Encounter (Signed)
Pt would like a call from a nurse to talk about a continued problem. Tried to make appt but wanted to talk to a nurse

## 2020-03-20 NOTE — Telephone Encounter (Signed)
Telephoned patient at home number and patient states sill having bleeding with clotting as well. Bleeding is some better but last cycle was quite a bit of bleeding. Per notes on chart would need to schedule appointment. Patient scheduled. Patient voiced understanding.

## 2020-03-24 ENCOUNTER — Ambulatory Visit: Payer: PRIVATE HEALTH INSURANCE | Admitting: Obstetrics and Gynecology

## 2020-03-31 ENCOUNTER — Encounter: Payer: Self-pay | Admitting: Obstetrics & Gynecology

## 2020-03-31 ENCOUNTER — Ambulatory Visit (INDEPENDENT_AMBULATORY_CARE_PROVIDER_SITE_OTHER): Payer: PRIVATE HEALTH INSURANCE | Admitting: Obstetrics & Gynecology

## 2020-03-31 VITALS — BP 132/87 | HR 87 | Ht 67.0 in | Wt 235.6 lb

## 2020-03-31 DIAGNOSIS — N920 Excessive and frequent menstruation with regular cycle: Secondary | ICD-10-CM

## 2020-03-31 MED ORDER — TRANEXAMIC ACID 650 MG PO TABS: 1300.0000 mg | ORAL_TABLET | Freq: Three times a day (TID) | ORAL | 11 refills | Status: DC

## 2020-03-31 NOTE — Progress Notes (Signed)
Patient ID: [name], female   DOB: August 23, 1981, 39 y.o.   MRN: [id number]      Chief Complaint  Patient presents with  . Menorrhagia      39 y.o. [id number] Patient's last menstrual period was 03/16/2020. The current method of family planning is OCP (estrogen/progesterone).  Outpatient Encounter Medications as of 03/31/2020  Medication Sig  . desogestrel-ethinyl estradiol (APRI) 0.15-30 MG-MCG tablet Take 1 tablet by mouth daily.  [name] desogestrel-ethinyl estradiol (ISIBLOOM) 0.15-30 MG-MCG tablet Take 1 tablet by mouth daily.  [name] loratadine (CLARITIN) 10 MG tablet Take 10 mg by mouth daily as needed for allergies.  [name] [name] 0.35 MG tablet TAKE 1 TABLET BY MOUTH ONCE A DAY (Patient not taking: Reported on 03/31/2020)  . megestrol (MEGACE) 40 MG tablet 3 tablets a day for 5 days, 2 tablets a day for 5 days then 1 tablet daily (Patient not taking: Reported on 03/31/2020)  . tranexamic acid (LYSTEDA) 650 MG TABS tablet Take 2 tablets (1,300 mg total) by mouth 3 (three) times daily.   No facility-administered encounter medications on file as of 03/31/2020.    Subjective Pt has had marked improvement after the megestrol synchronization follwed by COC 35 mic desogestrel but this last cycle she had 3 hours of extremely heavy bleeding Clots went through multiple products then improved No crampong Past Medical History:  Diagnosis Date  . Environmental allergies   . Pregnant 07/11/2015  . 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 CESAREAN SECTION    . WISDOM TOOTH EXTRACTION     2005    OB History    Gravida  2   Para  2   Term  2   Preterm      AB      Living  2     SAB      TAB      Ectopic      Multiple  0   Live Births  2           No Known Allergies  Social History   Socioeconomic History  . Marital status: Married    Spouse  name: Not on file  . Number of children: Not on file  . Years of education: Not on file  . Highest education level: Not on file  Occupational History  . Not on file  Tobacco Use  . Smoking status: Never Smoker  . Smokeless tobacco: Never Used  Substance and Sexual Activity  . Alcohol use: Yes    Alcohol/week: 0.0 standard drinks    Comment: occ  . Drug use: No  . Sexual activity: Yes    Birth control/protection: Pill  Other Topics Concern  . Not on file  Social History Narrative  . Not on file   Social Determinants of Health   Financial Resource Strain:   . Difficulty of Paying Living Expenses:   Food Insecurity:   . Worried About Charity fundraiser in the Last Year:   . Arboriculturist in the Last Year:   Transportation Needs:   . Film/video editor (Medical):   [name] Lack of Transportation (Non-Medical):   Physical Activity:   . Days of Exercise per Week:   . Minutes of Exercise per Session:   Stress:   . Feeling of Stress :   Social Connections:   .  Frequency of Communication with Friends and Family:   . Frequency of Social Gatherings with Friends and Family:   . Attends Religious Services:   . Active Member of Clubs or Organizations:   . Attends Banker Meetings:   [name] Marital Status:     Family History  Problem Relation Age of Onset  . Hypertension Mother   . Hypertension Father   . Macular degeneration Father   . Stroke Paternal Grandmother   . Stroke Maternal Grandmother   . Cancer Maternal Grandfather        lung  . ALS Paternal Grandfather   . Cancer Other        colon-maternal great grandma    Medications:       Current Outpatient Medications:  .  desogestrel-ethinyl estradiol (APRI) 0.15-30 MG-MCG tablet, Take 1 tablet by mouth daily., Disp: 1 Package, Rfl: 11 .  desogestrel-ethinyl estradiol (ISIBLOOM) 0.15-30 MG-MCG tablet, Take 1 tablet by mouth daily., Disp: , Rfl:  .  loratadine (CLARITIN) 10 MG tablet, Take 10 mg by mouth  daily as needed for allergies., Disp: , Rfl:  .  [name] 0.35 MG tablet, TAKE 1 TABLET BY MOUTH ONCE A DAY (Patient not taking: Reported on 03/31/2020), Disp: 1 Package, Rfl: 12 .  megestrol (MEGACE) 40 MG tablet, 3 tablets a day for 5 days, 2 tablets a day for 5 days then 1 tablet daily (Patient not taking: Reported on 03/31/2020), Disp: 45 tablet, Rfl: 0 .  tranexamic acid (LYSTEDA) 650 MG TABS tablet, Take 2 tablets (1,300 mg total) by mouth 3 (three) times daily., Disp: 30 tablet, Rfl: 11  Objective Blood pressure 132/87, pulse 87, height 5\' 7"  (1.702 m), weight 235 lb 9.6 oz (106.9 kg), last menstrual period 03/16/2020.  Gen WDWN NAD  Pertinent ROS No burning with urination, frequency or urgency No nausea, vomiting or diarrhea Nor fever chills or other constitutional symptoms   Labs or studies     Impression Diagnoses this Encounter::   ICD-10-CM   1. Menorrhagia with regular cycle  N92.0     Established relevant diagnosis(es):   Plan/Recommendations: Meds ordered this encounter  Medications  . tranexamic acid (LYSTEDA) 650 MG TABS tablet    Sig: Take 2 tablets (1,300 mg total) by mouth 3 (three) times daily.    Dispense:  30 tablet    Refill:  11    Labs or Scans Ordered: No orders of the defined types were placed in this encounter.   Management:: [facility] If continues to be problematic going forward then I recommend endometrial ablation  Follow up Return if symptoms worsen or fail to improve.        Face to face time:  15 minutes  Greater than 50% of the visit time was spent in counseling and coordination of care with the patient.  The summary and outline of the counseling and care coordination is summarized in the note above.   All questions were answered.

## 2020-10-23 ENCOUNTER — Other Ambulatory Visit: Payer: Self-pay | Admitting: Obstetrics & Gynecology

## 2021-06-07 ENCOUNTER — Other Ambulatory Visit: Payer: Self-pay | Admitting: Obstetrics & Gynecology

## 2021-10-01 ENCOUNTER — Other Ambulatory Visit: Payer: Self-pay | Admitting: Obstetrics & Gynecology

## 2022-07-29 ENCOUNTER — Other Ambulatory Visit: Payer: Self-pay | Admitting: Obstetrics & Gynecology

## 2022-08-01 ENCOUNTER — Other Ambulatory Visit: Payer: Self-pay | Admitting: *Deleted

## 2022-08-01 MED ORDER — TRANEXAMIC ACID 650 MG PO TABS: ORAL_TABLET | ORAL | 11 refills | Status: AC

## 2022-08-01 NOTE — Telephone Encounter (Signed)
Patient called requesting refill on Lysteda.

## 2022-09-20 ENCOUNTER — Ambulatory Visit: Payer: PRIVATE HEALTH INSURANCE | Admitting: Obstetrics & Gynecology

## 2022-09-27 ENCOUNTER — Ambulatory Visit (INDEPENDENT_AMBULATORY_CARE_PROVIDER_SITE_OTHER): Payer: PRIVATE HEALTH INSURANCE | Admitting: Obstetrics & Gynecology

## 2022-09-27 ENCOUNTER — Encounter: Payer: Self-pay | Admitting: Obstetrics & Gynecology

## 2022-09-27 ENCOUNTER — Other Ambulatory Visit (HOSPITAL_COMMUNITY)
Admission: RE | Admit: 2022-09-27 | Discharge: 2022-09-27 | Disposition: A | Discharge status: Home / Self Care | Payer: PRIVATE HEALTH INSURANCE | Source: Ambulatory Visit | Attending: Obstetrics & Gynecology | Admitting: Obstetrics & Gynecology

## 2022-09-27 VITALS — BP 143/97 | HR 99 | Ht 66.0 in | Wt 244.5 lb

## 2022-09-27 DIAGNOSIS — N920 Excessive and frequent menstruation with regular cycle: Secondary | ICD-10-CM

## 2022-09-27 DIAGNOSIS — Z01419 Encounter for gynecological examination (general) (routine) without abnormal findings: Secondary | ICD-10-CM | POA: Diagnosis present

## 2022-09-27 DIAGNOSIS — N946 Dysmenorrhea, unspecified: Secondary | ICD-10-CM

## 2022-09-27 NOTE — Progress Notes (Signed)
Subjective:     [name] is a 41 y.o. female here for a routine exam.  Patient's last menstrual period was 09/26/2022. [id number] Birth Control Method:  COC-->POP Menstrual Calendar(currently): regular  Current complaints: dysmenorrhea + HMB.   Current acute medical issues:  HTN   Recent Gynecologic History Patient's last menstrual period was 09/26/2022. Last Pap: 2019,  normal Last mammogram: age 64,    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-no prob    Past Surgical History:  Procedure Laterality Date   CESAREAN SECTION     WISDOM TOOTH EXTRACTION     2005    OB History     Gravida  2   Para  2   Term  2   Preterm      AB      Living  2      SAB      IAB      Ectopic      Multiple  0   Live Births  2           Social History   Socioeconomic History   Marital status: Married    Spouse name: Not on file   Number of children: Not on file   Years of education: Not on file   Highest education level: Not on file  Occupational History   Not on file  Tobacco Use   Smoking status: Never   Smokeless tobacco: Never  Vaping Use   Vaping Use: Never used  Substance and Sexual Activity   Alcohol use: Yes    Alcohol/week: 0.0 standard drinks of alcohol    Comment: occ   Drug use: No   Sexual activity: Yes    Birth control/protection: Pill  Other Topics Concern   Not on file  Social History Narrative   Not on file   Social Determinants of Health   Financial Resource Strain: Low Risk  (09/27/2022)   Overall Financial Resource Strain (CARDIA)    Difficulty of Paying Living Expenses: Not very hard  Food Insecurity: No Food Insecurity (09/27/2022)   Hunger Vital Sign    Worried About Running Out of Food in the Last Year: Never true    Ran Out of Food in the Last Year: Never  true  Transportation Needs: No Transportation Needs (09/27/2022)   PRAPARE - Hydrologist (Medical): No    Lack of Transportation (Non-Medical): No  Physical Activity: Inactive (09/27/2022)   Exercise Vital Sign    Days of Exercise per Week: 0 days    Minutes of Exercise per Session: 0 min  Stress: No Stress Concern Present (09/27/2022)   [place]    Feeling of Stress : Only a little  Social Connections: Socially Integrated (09/27/2022)   Social Connection and Isolation Panel [NHANES]    Frequency of Communication with Friends and Family: More than three times a week    Frequency of Social Gatherings with Friends and Family: Three times a week    Attends Religious Services: More than 4 times per year    Active Member of Clubs or Organizations: Yes    Attends Archivist Meetings: 1 to 4 times per year    Marital Status: Married    Family  History  Problem Relation Age of Onset   ALS Paternal Grandfather    Stroke Paternal Grandmother    Stroke Maternal Grandmother    Cancer Maternal Grandfather        lung   Hypertension Father    Macular degeneration Father    Colon cancer Mother    Hypertension Mother    Supraventricular tachycardia Mother    Cancer Other        colon-maternal great grandma     Current Outpatient Medications:    ISIBLOOM 0.15-30 MG-MCG tablet, TAKE 1 TABLET BY MOUTH ONCE DAILY, Disp: 28 tablet, Rfl: 12   loratadine (CLARITIN) 10 MG tablet, Take 10 mg by mouth daily., Disp: , Rfl:    olmesartan (BENICAR) 20 MG tablet, Take 20 mg by mouth daily., Disp: , Rfl:    tranexamic acid (LYSTEDA) 650 MG TABS tablet, TAKE 2 TABLETS (1,300 MG) BY MOUTH 3 TIMES DAILY, Disp: 30 tablet, Rfl: 11   UNABLE TO FIND, Z Pack, Disp: , Rfl:   Review of Systems  Review of Systems  Constitutional: Negative for fever, chills, weight loss, malaise/fatigue and diaphoresis.   HENT: Negative for hearing loss, ear pain, nosebleeds, congestion, sore throat, neck pain, tinnitus and ear discharge.   Eyes: Negative for blurred vision, double vision, photophobia, pain, discharge and redness.  Respiratory: Negative for cough, hemoptysis, sputum production, shortness of breath, wheezing and stridor.   Cardiovascular: Negative for chest pain, palpitations, orthopnea, claudication, leg swelling and PND.  Gastrointestinal: negative for abdominal pain. Negative for heartburn, nausea, vomiting, diarrhea, constipation, blood in stool and melena.  Genitourinary: Negative for dysuria, urgency, frequency, hematuria and flank pain.  Musculoskeletal: Negative for myalgias, back pain, joint pain and falls.  Skin: Negative for itching and rash.  Neurological: Negative for dizziness, tingling, tremors, sensory change, speech change, focal weakness, seizures, loss of consciousness, weakness and headaches.  Endo/Heme/Allergies: Negative for environmental allergies and polydipsia. Does not bruise/bleed easily.  Psychiatric/Behavioral: Negative for depression, suicidal ideas, hallucinations, memory loss and substance abuse. The patient is not nervous/anxious and does not have insomnia.        Objective:  Blood pressure (!) 143/97, pulse 99, height 5\' 6"  (1.676 m), weight 244 lb 8 oz (110.9 kg), last menstrual period 09/26/2022.   Physical Exam  Vitals reviewed. Constitutional: She is oriented to person, place, and time. She appears well-developed and well-nourished.  HENT:  Head: Normocephalic and atraumatic.        Right Ear: External ear normal.  Left Ear: External ear normal.  Nose: Nose normal.  Mouth/Throat: Oropharynx is clear and moist.  Eyes: Conjunctivae and EOM are normal. Pupils are equal, round, and reactive to light. Right eye exhibits no discharge. Left eye exhibits no discharge. No scleral icterus.  Neck: Normal range of motion. Neck supple. No tracheal deviation present.  No thyromegaly present.  Cardiovascular: Normal rate, regular rhythm, normal heart sounds and intact distal pulses.  Exam reveals no gallop and no friction rub.   No murmur heard. Respiratory: Effort normal and breath sounds normal. No respiratory distress. She has no wheezes. She has no rales. She exhibits no tenderness.  GI: Soft. Bowel sounds are normal. She exhibits no distension and no mass. There is no tenderness. There is no rebound and no guarding.  Genitourinary:  Breasts no masses skin changes or nipple changes bilaterally      Vulva is normal without lesions Vagina is pink moist without discharge Cervix normal in appearance and pap is done Uterus  is normal size shape and contour Adnexa is negative with normal sized ovaries   Musculoskeletal: Normal range of motion. She exhibits no edema and no tenderness.  Neurological: She is alert and oriented to person, place, and time. She has normal reflexes. She displays normal reflexes. No cranial nerve deficit. She exhibits normal muscle tone. Coordination normal.  Skin: Skin is warm and dry. No rash noted. No erythema. No pallor.  Psychiatric: She has a normal mood and affect. Her behavior is normal. Judgment and thought content normal.       Medications Ordered at today's visit: No orders of the defined types were placed in this encounter.   Other orders placed at today's visit: No orders of the defined types were placed in this encounter.     Assessment:    Normal Gyn exam.   HMB + dysmenorrhea HTN Plan:    Will switch to POP slynd, discussed Mirena IUD, continuous pills etc as management in a dditon to lysteda     Return in about 3 years (around 09/27/2025) for yearly.

## 2022-09-30 LAB — CYTOLOGY - PAP
Comment: NEGATIVE
Diagnosis: NEGATIVE
High risk HPV: NEGATIVE

## 2023-03-10 ENCOUNTER — Encounter: Payer: Self-pay | Admitting: Obstetrics & Gynecology

## 2023-03-11 ENCOUNTER — Other Ambulatory Visit: Payer: Self-pay | Admitting: *Deleted

## 2023-03-12 MED ORDER — SLYND 4 MG PO TABS: 1.0000 | ORAL_TABLET | Freq: Every day | ORAL | 11 refills | Status: AC

## 2023-07-01 ENCOUNTER — Ambulatory Visit
Admission: EM | Admit: 2023-07-01 | Discharge: 2023-07-01 | Disposition: A | Discharge status: Home / Self Care | Payer: PRIVATE HEALTH INSURANCE | Attending: Family Medicine | Admitting: Family Medicine

## 2023-07-01 DIAGNOSIS — U071 COVID-19: Secondary | ICD-10-CM | POA: Insufficient documentation

## 2023-07-01 DIAGNOSIS — J069 Acute upper respiratory infection, unspecified: Secondary | ICD-10-CM

## 2023-07-01 DIAGNOSIS — R059 Cough, unspecified: Secondary | ICD-10-CM | POA: Diagnosis present

## 2023-07-01 NOTE — Discharge Instructions (Signed)
You may take medications such as DayQuil, NyQuil, Flonase, sinus rinses, humidifiers, vapor rubs and your COVID test to be back tomorrow.  If positive, someone should reach out and discuss if you are interested in antiviral therapy.

## 2023-07-01 NOTE — ED Triage Notes (Signed)
Pt c/o headache,earache body aches, chills, sinus drainage, cough sore throat x 1 day.

## 2023-07-01 NOTE — ED Provider Notes (Signed)
[facility]    CSN: [id number] Arrival date & time: 07/01/23  1150      History   Chief Complaint No chief complaint on file.   HPI [name] is a 42 y.o. female.   Presenting today with 1 day history of fever, headache, body aches, sore throat, cough, congestion.  Denies chest pain, shortness of breath, abdominal pain, nausea vomiting or diarrhea.  So far trying Tylenol with minimal relief.  No known sick contacts recently.    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-no prob    Patient Active Problem List   Diagnosis Date Noted   Labor and delivery indication for care or intervention 02/21/2016   Supervision of normal pregnancy 08/04/2015   H/O VSD (ventricular septal defect) 08/04/2015   Previous cesarean section complicating pregnancy 08/04/2015    Past Surgical History:  Procedure Laterality Date   CESAREAN SECTION     WISDOM TOOTH EXTRACTION     2005    OB History     Gravida  2   Para  2   Term  2   Preterm      AB      Living  2      SAB      IAB      Ectopic      Multiple  0   Live Births  2            Home Medications    Prior to Admission medications   Medication Sig Start Date End Date Taking? Authorizing Provider  Drospirenone (SLYND) 4 MG TABS Take 1 tablet (4 mg total) by mouth daily. 03/12/23 02/11/24  Lazaro Arms, MD  ISIBLOOM 0.15-30 MG-MCG tablet TAKE 1 TABLET BY MOUTH ONCE DAILY 10/02/21   Lazaro Arms, MD  loratadine (CLARITIN) 10 MG tablet Take 10 mg by mouth daily.    [provider]  olmesartan (BENICAR) 20 MG tablet Take 20 mg by mouth daily.    [provider]  tranexamic acid (LYSTEDA) 650 MG TABS tablet TAKE 2 TABLETS (1,300 MG) BY MOUTH 3 TIMES DAILY 08/01/22   Lazaro Arms, MD  UNABLE TO FIND Z Pack     [provider]    Family History Family History  Problem Relation Age of Onset   ALS Paternal Grandfather    Stroke Paternal Grandmother    Stroke Maternal Grandmother    Cancer Maternal Grandfather        lung   Hypertension Father    Macular degeneration Father    Colon cancer Mother    Hypertension Mother    Supraventricular tachycardia Mother    Cancer Other        colon-maternal great grandma    Social History Social History   Tobacco Use   Smoking status: Never   Smokeless tobacco: Never  Vaping Use   Vaping status: Never Used  Substance Use Topics   Alcohol use: Yes    Alcohol/week: 0.0 standard drinks of alcohol    Comment: occ   Drug use: No     Allergies   Patient has no known allergies.   Review of Systems Review of Systems Per HPI  Physical Exam Triage Vital Signs ED Triage Vitals [07/01/23 1314]  Encounter Vitals Group  BP 114/80     Systolic BP Percentile      Diastolic BP Percentile      Pulse Rate (!) 121     Resp 13     Temp 100 F (37.8 C)     Temp Source Oral     SpO2 97 %     Weight      Height      Head Circumference      Peak Flow      Pain Score 6     Pain Loc      Pain Education      Exclude from Growth Chart    No data found.  Updated Vital Signs BP 114/80 (BP Location: Right Arm)   Pulse (!) 121   Temp 100 F (37.8 C) (Oral)   Resp 13   SpO2 97%   Visual Acuity Right Eye Distance:   Left Eye Distance:   Bilateral Distance:    Right Eye Near:   Left Eye Near:    Bilateral Near:     Physical Exam Vitals and nursing note reviewed.  Constitutional:      Appearance: Normal appearance.  HENT:     Head: Atraumatic.     Right Ear: Tympanic membrane and external ear normal.     Left Ear: Tympanic membrane and external ear normal.     Nose: Rhinorrhea present.     Mouth/Throat:     Mouth: Mucous membranes are moist.     Pharynx: Posterior oropharyngeal erythema present.  Eyes:      Extraocular Movements: Extraocular movements intact.     Conjunctiva/sclera: Conjunctivae normal.  Cardiovascular:     Rate and Rhythm: Normal rate and regular rhythm.     Heart sounds: Normal heart sounds.  Pulmonary:     Effort: Pulmonary effort is normal.     Breath sounds: Normal breath sounds. No wheezing.  Musculoskeletal:        General: Normal range of motion.     Cervical back: Normal range of motion and neck supple.  Skin:    General: Skin is warm and dry.  Neurological:     Mental Status: She is alert and oriented to person, place, and time.     Motor: No weakness.     Gait: Gait normal.  Psychiatric:        Mood and Affect: Mood normal.        Thought Content: Thought content normal.      UC Treatments / Results  Labs (all labs ordered are listed, but only abnormal results are displayed) Labs Reviewed  SARS CORONAVIRUS 2 (TAT 6-24 HRS)    EKG   Radiology No results found.  Procedures Procedures (including critical care time)  Medications Ordered in UC Medications - No data to display  Initial Impression / Assessment and Plan / UC Course  I have reviewed the triage vital signs and the nursing notes.  Pertinent labs & imaging results that were available during my care of the patient were reviewed by me and considered in my medical decision making (see chart for details).     Strong suspicion for COVID-19, low-grade fever and tachycardia in triage, COVID testing pending, good candidate for Paxlovid if positive.  Treat with supportive over-the-counter medications, home care.  Return for worsening symptoms.  Final Clinical Impressions(s) / UC Diagnoses   Final diagnoses:  Viral URI with cough     Discharge Instructions      You may take medications  such as DayQuil, NyQuil, Flonase, sinus rinses, humidifiers, vapor rubs and your COVID test to be back tomorrow.  If positive, someone should reach out and discuss if you are interested in antiviral  therapy.    ED Prescriptions   None    PDMP not reviewed this encounter.   Particia Nearing, [place] 07/01/23 1437

## 2023-07-02 ENCOUNTER — Telehealth: Payer: Self-pay

## 2023-07-02 ENCOUNTER — Ambulatory Visit
Admission: EM | Admit: 2023-07-02 | Discharge: 2023-07-02 | Disposition: A | Discharge status: Home / Self Care | Payer: PRIVATE HEALTH INSURANCE | Attending: Family Medicine | Admitting: Family Medicine

## 2023-07-02 DIAGNOSIS — Z79899 Other long term (current) drug therapy: Secondary | ICD-10-CM | POA: Diagnosis not present

## 2023-07-02 LAB — SARS CORONAVIRUS 2 (TAT 6-24 HRS): SARS Coronavirus 2: POSITIVE — AB

## 2023-07-02 MED ORDER — PAXLOVID (300/100) 20 X 150 MG & 10 X 100MG PO TBPK: 3.0000 | ORAL_TABLET | Freq: Two times a day (BID) | ORAL | 0 refills | Status: AC

## 2023-07-02 NOTE — Discharge Instructions (Signed)
You have had labs (electrolytes and kidney function) sent today. We will call you with any significant abnormalities or if there is need to begin or change treatment or pursue further follow up.  You may also review your test results online through MyChart. If you do not have a MyChart account, instructions to sign up should be on your discharge paperwork.

## 2023-07-02 NOTE — Telephone Encounter (Signed)
Pt called with questions about results see to see if she was correctly reading the results confirmed that results are positive   Today py reports fluid in her ears that she can hear moving around, and coughing up and sneezing yellowish greenish mucus.   Provider note from yesterday states pt is a good candidate for paxlovid.   Provider has been notified of the new found symptoms.   Pt has been notified that she will receive a Rx for paxlovid but needs to have a renal function on file.    Pt has agreed to come in to have a BMP drawn.

## 2023-07-02 NOTE — ED Provider Notes (Signed)
  [facility]   [id number] 07/02/23 Arrival Time: 1339  ASSESSMENT & PLAN:  1. High risk medication use    Seen yesterday. COVID +. Desires Rx Paxlovid. Printed rx given. BMP pending; no recent Cr on file; no h/o known kidney issues. We can inform of any abnormality tomorrow and have her stop Paxlovid in necessary.   New Prescriptions   NIRMATRELVIR & RITONAVIR (PAXLOVID, 300/100,) 20 X 150 MG & 10 X 100MG  TBPK    Take 3 tablets by mouth 2 (two) times daily for 5 days.     Follow-up Information     Carylon Perches, MD.   Specialty: Internal Medicine Why: As needed. Contact information: [address] [place] [address] [phone]                 Reviewed expectations re: course of current medical issues. Questions answered. Outlined signs and symptoms indicating need for more acute intervention. Understanding verbalized. After Visit Summary given.     Mardella Layman, MD 07/02/23 1351

## 2023-07-03 LAB — BASIC METABOLIC PANEL
BUN/Creatinine Ratio: 9 (ref 9–23)
BUN: 8 mg/dL (ref 6–20)
CO2: 19 mmol/L — ABNORMAL LOW (ref 20–29)
Calcium: 8.9 mg/dL (ref 8.7–10.2)
Chloride: 107 mmol/L — ABNORMAL HIGH (ref 96–106)
Creatinine, Ser: 0.88 mg/dL (ref 0.57–1.00)
Glucose: 127 mg/dL — ABNORMAL HIGH (ref 70–99)
Potassium: 3.8 mmol/L (ref 3.5–5.2)
Sodium: 141 mmol/L (ref 134–144)
eGFR: 86 mL/min/{1.73_m2} (ref >59)

## 2023-07-15 ENCOUNTER — Encounter: Payer: Self-pay | Admitting: Obstetrics & Gynecology

## 2023-09-20 ENCOUNTER — Ambulatory Visit
Admission: EM | Admit: 2023-09-20 | Discharge: 2023-09-20 | Disposition: A | Discharge status: Home / Self Care | Payer: PRIVATE HEALTH INSURANCE | Attending: Nurse Practitioner | Admitting: Nurse Practitioner

## 2023-09-20 DIAGNOSIS — B349 Viral infection, unspecified: Secondary | ICD-10-CM

## 2023-09-20 LAB — POC COVID19/FLU A&B COMBO
Covid Antigen, POC: NEGATIVE
Influenza A Antigen, POC: NEGATIVE
Influenza B Antigen, POC: NEGATIVE

## 2023-09-20 NOTE — Discharge Instructions (Addendum)
The COVID/flu test was negative.  It is likely this is a viral illness. Recommend over-the-counter Tylenol or ibuprofen as needed for pain, fever, or general discomfort. Increase fluids and allow for plenty of rest.  Try to drink at least 8-10 8 ounce glasses of water while symptoms persist. May take over-the-counter cough and cold medications such as Mucinex, TheraFlu, or Tylenol. If symptoms have not improved over the next 7 to 10 days, or if they suddenly worsen before that time, you may follow-up in this clinic or with your primary care physician for further evaluation. Follow-up as needed.

## 2023-09-20 NOTE — ED Provider Notes (Signed)
[facility]    CSN: [id number] Arrival date & time: 09/20/23  1019      History   Chief Complaint Chief Complaint  Patient presents with   Cough   Fever   Chills   Generalized Body Aches    HPI [name] is a 42 y.o. female.   The history is provided by the patient.   Patient presents for complaints of bodyaches, chills, cough, headache, and low-grade temperature that started over the past 48 hours.  Patient denies fever, ear pain, wheezing, difficulty breathing, chest pain, abdominal pain, nausea, vomiting, diarrhea, or rash.  Patient reports she was diagnosed with COVID in August.  Patient reports she has been taking Tylenol for her symptoms.  Denies any obvious known sick contacts.  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-no prob    Patient Active Problem List   Diagnosis Date Noted   Labor and delivery indication for care or intervention 02/21/2016   Supervision of normal pregnancy 08/04/2015   H/O VSD (ventricular septal defect) 08/04/2015   Previous cesarean section complicating pregnancy 08/04/2015    Past Surgical History:  Procedure Laterality Date   CESAREAN SECTION     WISDOM TOOTH EXTRACTION     2005    OB History     Gravida  2   Para  2   Term  2   Preterm      AB      Living  2      SAB      IAB      Ectopic      Multiple  0   Live Births  2            Home Medications    Prior to Admission medications   Medication Sig Start Date End Date Taking? Authorizing Provider  Drospirenone (SLYND) 4 MG TABS Take 1 tablet (4 mg total) by mouth daily. 03/12/23 02/11/24  Lazaro Arms, MD  ISIBLOOM 0.15-30 MG-MCG tablet TAKE 1 TABLET BY MOUTH ONCE DAILY 10/02/21   Lazaro Arms, MD  loratadine (CLARITIN) 10 MG tablet Take 10 mg by mouth daily.     [provider]  olmesartan (BENICAR) 20 MG tablet Take 20 mg by mouth daily.    [provider]  tranexamic acid (LYSTEDA) 650 MG TABS tablet TAKE 2 TABLETS (1,300 MG) BY MOUTH 3 TIMES DAILY 08/01/22   Lazaro Arms, MD  UNABLE TO FIND Z Pack    [provider]    Family History Family History  Problem Relation Age of Onset   ALS Paternal Grandfather    Stroke Paternal Grandmother    Stroke Maternal Grandmother    Cancer Maternal Grandfather        lung   Hypertension Father    Macular degeneration Father    Colon cancer Mother    Hypertension Mother    Supraventricular tachycardia Mother    Cancer Other        colon-maternal great grandma    Social History Social History   Tobacco Use   Smoking status: Never   Smokeless tobacco: Never  Vaping Use   Vaping status: Never Used  Substance Use Topics   Alcohol use: Yes    Alcohol/week: 0.0 standard drinks of alcohol  Comment: occ   Drug use: No     Allergies   Patient has no known allergies.   Review of Systems Review of Systems Per HPI  Physical Exam Triage Vital Signs ED Triage Vitals  Encounter Vitals Group     BP 09/20/23 1121 116/79     Systolic BP Percentile --      Diastolic BP Percentile --      Pulse Rate 09/20/23 1121 (!) 111     Resp 09/20/23 1121 18     Temp 09/20/23 1121 98.9 F (37.2 C)     Temp Source 09/20/23 1121 Oral     SpO2 09/20/23 1121 97 %     Weight --      Height --      Head Circumference --      Peak Flow --      Pain Score 09/20/23 1119 6     Pain Loc --      Pain Education --      Exclude from Growth Chart --    No data found.  Updated Vital Signs BP 116/79 (BP Location: Right Arm)   Pulse (!) 111   Temp 98.9 F (37.2 C) (Oral)   Resp 18   SpO2 97%   Visual Acuity Right Eye Distance:   Left Eye Distance:   Bilateral Distance:    Right Eye Near:   Left Eye Near:    Bilateral Near:     Physical Exam Vitals and nursing note  reviewed.  Constitutional:      General: She is not in acute distress.    Appearance: Normal appearance.  HENT:     Head: Normocephalic.     Right Ear: Tympanic membrane, ear canal and external ear normal.     Left Ear: Tympanic membrane, ear canal and external ear normal.     Nose: Nose normal.     Right Turbinates: Enlarged and swollen.     Left Turbinates: Enlarged and swollen.     Right Sinus: No maxillary sinus tenderness or frontal sinus tenderness.     Left Sinus: No maxillary sinus tenderness or frontal sinus tenderness.     Mouth/Throat:     Lips: Pink.     Mouth: Mucous membranes are moist.     Pharynx: Oropharynx is clear. Uvula midline.  Eyes:     Extraocular Movements: Extraocular movements intact.     Conjunctiva/sclera: Conjunctivae normal.     Pupils: Pupils are equal, round, and reactive to light.  Cardiovascular:     Rate and Rhythm: Regular rhythm.     Pulses: Normal pulses.     Heart sounds: Normal heart sounds.  Pulmonary:     Effort: Pulmonary effort is normal.     Breath sounds: Normal breath sounds.  Abdominal:     General: Bowel sounds are normal.     Palpations: Abdomen is soft.     Tenderness: There is no abdominal tenderness.  Musculoskeletal:     Cervical back: Normal range of motion.  Lymphadenopathy:     Cervical: No cervical adenopathy.  Skin:    General: Skin is warm and dry.  Neurological:     General: No focal deficit present.     Mental Status: She is alert and oriented to person, place, and time.  Psychiatric:        Mood and Affect: Mood normal.        Behavior: Behavior normal.      UC Treatments / Results  Labs (all labs ordered are listed, but only abnormal results are displayed) Labs Reviewed  POC COVID19/FLU A&B COMBO    EKG   Radiology No results found.  Procedures Procedures (including critical care time)  Medications Ordered in UC Medications - No data to display  Initial Impression / Assessment and Plan  / UC Course  I have reviewed the triage vital signs and the nursing notes.  Pertinent labs & imaging results that were available during my care of the patient were reviewed by me and considered in my medical decision making (see chart for details).  COVID/flu test was negative.  Suspect patient has a viral illness at this time.  Supportive care recommendations were provided and discussed with the patient to include fluids, rest, over-the-counter analgesics, and over-the-counter cough and cold medications such as Mucinex, TheraFlu, or Tylenol.  Discussed viral etiology with the patient and when symptoms should improve.  Patient advised to follow-up for worsening or other concerns.  Patient is in agreement with this plan of care and verbalizes understanding.  All questions were answered.  Patient stable for discharge.  Work note was provided.  Final Clinical Impressions(s) / UC Diagnoses   Final diagnoses:  Viral illness     Discharge Instructions      The COVID/flu test was negative.  It is likely this is a viral illness. Recommend over-the-counter Tylenol or ibuprofen as needed for pain, fever, or general discomfort. Increase fluids and allow for plenty of rest.  Try to drink at least 8-10 8 ounce glasses of water while symptoms persist. May take over-the-counter cough and cold medications such as Mucinex, TheraFlu, or Tylenol. If symptoms have not improved over the next 7 to 10 days, or if they suddenly worsen before that time, you may follow-up in this clinic or with your primary care physician for further evaluation. Follow-up as needed.     ED Prescriptions   None    PDMP not reviewed this encounter.   Abran Cantor, NP 09/20/23 1226

## 2023-09-20 NOTE — ED Triage Notes (Signed)
Patient reports Thursday afternoon she began having body aches, chills, coughing, and low grade fever.   Home interventions: tylenol last night

## 2023-12-30 ENCOUNTER — Encounter: Payer: Self-pay | Admitting: Obstetrics & Gynecology

## 2024-03-01 ENCOUNTER — Encounter: Payer: Self-pay | Admitting: Obstetrics & Gynecology

## 2024-04-26 ENCOUNTER — Encounter: Payer: Self-pay | Admitting: Obstetrics & Gynecology

## 2024-04-27 ENCOUNTER — Encounter: Payer: Self-pay | Admitting: *Deleted

## 2024-06-21 ENCOUNTER — Encounter: Payer: Self-pay | Admitting: Obstetrics & Gynecology

## 2024-09-02 ENCOUNTER — Other Ambulatory Visit: Payer: Self-pay | Admitting: *Deleted

## 2024-09-02 ENCOUNTER — Encounter: Payer: Self-pay | Admitting: Obstetrics & Gynecology

## 2024-09-02 MED ORDER — DROSPIRENONE 4 MG PO TABS: ORAL_TABLET | ORAL | Status: AC

## 2024-09-05 ENCOUNTER — Telehealth: Payer: Self-pay

## 2024-09-05 ENCOUNTER — Ambulatory Visit
Admission: EM | Admit: 2024-09-05 | Discharge: 2024-09-05 | Disposition: A | Discharge status: Home / Self Care | Payer: PRIVATE HEALTH INSURANCE | Attending: Nurse Practitioner | Admitting: Nurse Practitioner

## 2024-09-05 DIAGNOSIS — R21 Rash and other nonspecific skin eruption: Secondary | ICD-10-CM | POA: Diagnosis not present

## 2024-09-05 MED ORDER — PREDNISONE 20 MG PO TABS: 40.0000 mg | ORAL_TABLET | Freq: Every day | ORAL | 0 refills | Status: AC

## 2024-09-05 MED ORDER — TRIAMCINOLONE ACETONIDE 0.1 % EX CREA: 1.0000 | TOPICAL_CREAM | Freq: Two times a day (BID) | CUTANEOUS | 0 refills | Status: DC

## 2024-09-05 MED ORDER — TRIAMCINOLONE ACETONIDE 0.1 % EX CREA: 1.0000 | TOPICAL_CREAM | Freq: Two times a day (BID) | CUTANEOUS | 0 refills | Status: AC

## 2024-09-05 MED ORDER — DEXAMETHASONE SOD PHOSPHATE PF 10 MG/ML IJ SOLN
10.0000 mg | Freq: Once | INTRAMUSCULAR | Status: AC
  Administered 2024-09-05: 10 mg via INTRAMUSCULAR

## 2024-09-05 NOTE — Discharge Instructions (Addendum)
 You were given an injection of Decadron 10 mg.  Start the prednisone over the next 2 to 3 days if symptoms fail to improve. Take medication as prescribed. You may take over-the-counter Zyrtec, Claritin, or Allegra during the daytime and Benadryl  at bedtime as needed for itching. Avoid hot baths or showers.  Recommend lukewarm baths or showers. Apply cool compresses to the affected areas to help with inflammation. Do not scratch or manipulate the areas while symptoms persist. You may use over-the-counter Aveeno colloidal oatmeal bath to help with itching and drying of the rash.  If your symptoms fail to improve with this treatment, you may follow-up in this clinic or with your primary care physician for further evaluation. Follow-up as needed.

## 2024-09-05 NOTE — Telephone Encounter (Signed)
 Pt called stating pharmacy did not get triamcinolone cream. Script resent.

## 2024-09-05 NOTE — ED Triage Notes (Signed)
 Pt reports she has came in contact with poison oak and now has a rash all over her body x 1 week.

## 2024-09-05 NOTE — ED Provider Notes (Signed)
 [facility]    CSN: [id number] Arrival date & time: 09/05/24  0931      History   Chief Complaint No chief complaint on file.   HPI [name] is a 43 y.o. female.   The history is provided by the patient.   Patient presents with a 1 week history of rash to her generalized body.  Patient states she was out cutting weeds and believes that she came into contact with poison ivy.  She states the rash is spread to her left lower leg, to her right ear, to her neck, and on her left arm.  States that she has been using over-the-counter poison ivy scrub and also used a friend's steroid cream with minimal relief of her symptoms.  She denies fever, chills, or exposure to new soaps, medications, lotions, foods, or detergents.  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-no prob    Patient Active Problem List   Diagnosis Date Noted   Labor and delivery indication for care or intervention 02/21/2016   Supervision of normal pregnancy 08/04/2015   H/O VSD (ventricular septal defect) 08/04/2015   Previous cesarean section complicating pregnancy 08/04/2015    Past Surgical History:  Procedure Laterality Date   CESAREAN SECTION     WISDOM TOOTH EXTRACTION     2005    OB History     Gravida  2   Para  2   Term  2   Preterm      AB      Living  2      SAB      IAB      Ectopic      Multiple  0   Live Births  2            Home Medications    Prior to Admission medications   Medication Sig Start Date End Date Taking? Authorizing Provider  predniSONE (DELTASONE) 20 MG tablet Take 2 tablets (40 mg total) by mouth daily with breakfast for 5 days. 09/05/24 09/10/24 Yes Leath-Warren, Etta PARAS, NP  triamcinolone cream (KENALOG) 0.1 % Apply 1 Application topically 2 (two) times daily.  09/05/24  Yes Leath-Warren, Etta PARAS, NP  Drospirenone  4 MG TABS Take 1 tab daily by mouth 09/02/24   Jayne Vonn DEL, MD  ISIBLOOM  0.15-30 MG-MCG tablet TAKE 1 TABLET BY MOUTH ONCE DAILY 10/02/21   Jayne Vonn DEL, MD  loratadine (CLARITIN) 10 MG tablet Take 10 mg by mouth daily.    [provider]  olmesartan (BENICAR) 20 MG tablet Take 20 mg by mouth daily.    [provider]  tranexamic acid  (LYSTEDA ) 650 MG TABS tablet TAKE 2 TABLETS (1,300 MG) BY MOUTH 3 TIMES DAILY 08/01/22   Jayne Vonn DEL, MD  UNABLE TO FIND Z Pack    [provider]    Family History Family History  Problem Relation Age of Onset   ALS Paternal Grandfather    Stroke Paternal Grandmother    Stroke Maternal Grandmother    Cancer Maternal Grandfather        lung   Hypertension Father    Macular degeneration Father    Colon cancer Mother    Hypertension Mother    Supraventricular tachycardia Mother    Cancer Other  colon-maternal great grandma    Social History Social History   Tobacco Use   Smoking status: Never   Smokeless tobacco: Never  Vaping Use   Vaping status: Never Used  Substance Use Topics   Alcohol use: Yes    Alcohol/week: 0.0 standard drinks of alcohol    Comment: occ   Drug use: No     Allergies   Patient has no known allergies.   Review of Systems Review of Systems Per HPI  Physical Exam Triage Vital Signs ED Triage Vitals [09/05/24 0942]  Encounter Vitals Group     BP 114/76     Girls Systolic BP Percentile      Girls Diastolic BP Percentile      Boys Systolic BP Percentile      Boys Diastolic BP Percentile      Pulse Rate 93     Resp 18     Temp 98.8 F (37.1 C)     Temp Source Oral     SpO2 97 %     Weight      Height      Head Circumference      Peak Flow      Pain Score 0     Pain Loc      Pain Education      Exclude from Growth Chart    No data found.  Updated Vital Signs BP 114/76 (BP Location: Right Arm)   Pulse  93   Temp 98.8 F (37.1 C) (Oral)   Resp 18   LMP 08/31/2024   SpO2 97%   Visual Acuity Right Eye Distance:   Left Eye Distance:   Bilateral Distance:    Right Eye Near:   Left Eye Near:    Bilateral Near:     Physical Exam Vitals and nursing note reviewed.  Constitutional:      General: She is not in acute distress.    Appearance: Normal appearance.  HENT:     Head: Normocephalic.  Eyes:     Extraocular Movements: Extraocular movements intact.     Pupils: Pupils are equal, round, and reactive to light.  Cardiovascular:     Rate and Rhythm: Normal rate and regular rhythm.     Pulses: Normal pulses.     Heart sounds: Normal heart sounds.  Pulmonary:     Effort: Pulmonary effort is normal.     Breath sounds: Normal breath sounds.  Musculoskeletal:     Cervical back: Normal range of motion.  Skin:    General: Skin is warm and dry.     Findings: Rash present. Rash is macular and papular.     Comments: Diffuse maculopapular rash noted in multiple areas of the body.  The rash is in no congruent pattern.  There is no oozing, fluctuance, or drainage present.  Neurological:     General: No focal deficit present.     Mental Status: She is alert and oriented to person, place, and time.  Psychiatric:        Mood and Affect: Mood normal.        Behavior: Behavior normal.      UC Treatments / Results  Labs (all labs ordered are listed, but only abnormal results are displayed) Labs Reviewed - No data to display  EKG   Radiology No results found.  Procedures Procedures (including critical care time)  Medications Ordered in UC Medications  dexamethasone (DECADRON) injection 10 mg (10 mg Intramuscular Given 09/05/24 1004)  Initial Impression / Assessment and Plan / UC Course  I have reviewed the triage vital signs and the nursing notes.  Pertinent labs & imaging results that were available during my care of the patient were reviewed by me and considered in my  medical decision making (see chart for details).  Symptoms consistent with poison ivy rash.  Decadron 10 mg IM administered for rash and inflammation.  Will start patient on prednisone 40 mg for the next 5 days along with triamcinolone cream 0.1% for patient to apply topically.  Supportive care recommendations were provided and discussed with the patient to include use of over-the-counter antihistamines, avoiding hot baths or showers, and use of Aveeno colloidal oatmeal bath.  Discussed indications with patient regarding follow-up.  Patient was in agreement with this plan of care and verbalizes understanding.  All questions were answered.  Patient stable for discharge.   Final Clinical Impressions(s) / UC Diagnoses   Final diagnoses:  Rash and nonspecific skin eruption     Discharge Instructions      You were given an injection of Decadron 10 mg.  Start the prednisone over the next 2 to 3 days if symptoms fail to improve. Take medication as prescribed. You may take over-the-counter Zyrtec, Claritin, or Allegra during the daytime and Benadryl  at bedtime as needed for itching. Avoid hot baths or showers.  Recommend lukewarm baths or showers. Apply cool compresses to the affected areas to help with inflammation. Do not scratch or manipulate the areas while symptoms persist. You may use over-the-counter Aveeno colloidal oatmeal bath to help with itching and drying of the rash.  If your symptoms fail to improve with this treatment, you may follow-up in this clinic or with your primary care physician for further evaluation. Follow-up as needed.     ED Prescriptions     Medication Sig Dispense Auth. Provider   predniSONE (DELTASONE) 20 MG tablet Take 2 tablets (40 mg total) by mouth daily with breakfast for 5 days. 10 tablet Leath-Warren, Etta PARAS, NP   triamcinolone cream (KENALOG) 0.1 % Apply 1 Application topically 2 (two) times daily. 80 g Leath-Warren, Etta PARAS, NP      PDMP not  reviewed this encounter.   Gilmer Etta PARAS, NP 09/05/24 1007

## 2024-11-17 ENCOUNTER — Encounter: Payer: Self-pay | Admitting: Obstetrics & Gynecology

## 2024-11-18 ENCOUNTER — Other Ambulatory Visit: Payer: Self-pay | Admitting: *Deleted

## 2024-11-18 MED ORDER — DROSPIRENONE 4 MG PO TABS: 4.0000 mg | ORAL_TABLET | Freq: Every morning | ORAL | Status: AC

## 2024-11-29 ENCOUNTER — Other Ambulatory Visit (HOSPITAL_COMMUNITY): Payer: Self-pay | Admitting: Internal Medicine

## 2024-11-29 DIAGNOSIS — Z1231 Encounter for screening mammogram for malignant neoplasm of breast: Secondary | ICD-10-CM

## 2025-01-07 ENCOUNTER — Ambulatory Visit: Payer: PRIVATE HEALTH INSURANCE | Admitting: Obstetrics & Gynecology

## 2025-01-14 ENCOUNTER — Ambulatory Visit (HOSPITAL_COMMUNITY): Payer: PRIVATE HEALTH INSURANCE
